# Patient Record
Sex: Female | Born: 1965 | Race: Black or African American | Hispanic: No | Marital: Single | Smoking: Never smoker
Health system: Southern US, Community
[De-identification: ages and names within clinical notes are randomized; demographics above are authoritative.]

## PROBLEM LIST (undated history)

## (undated) HISTORY — PX: GASTRIC BYPASS: SHX52

## (undated) HISTORY — PX: HERNIA REPAIR: SHX51

---

## 2003-10-22 ENCOUNTER — Other Ambulatory Visit: Admission: RE | Admit: 2003-10-22 | Discharge: 2003-10-22 | Payer: Self-pay | Admitting: Obstetrics and Gynecology

## 2011-04-19 ENCOUNTER — Emergency Department (HOSPITAL_COMMUNITY)
Admission: EM | Admit: 2011-04-19 | Discharge: 2011-04-19 | Disposition: A | Discharge status: Home / Self Care | Payer: Self-pay | Attending: Emergency Medicine | Admitting: Emergency Medicine

## 2011-04-19 DIAGNOSIS — B370 Candidal stomatitis: Secondary | ICD-10-CM | POA: Insufficient documentation

## 2011-05-26 ENCOUNTER — Other Ambulatory Visit: Payer: Self-pay | Admitting: *Deleted

## 2011-05-26 DIAGNOSIS — Z1231 Encounter for screening mammogram for malignant neoplasm of breast: Secondary | ICD-10-CM

## 2011-06-14 ENCOUNTER — Ambulatory Visit: Payer: Self-pay

## 2012-11-27 ENCOUNTER — Ambulatory Visit (HOSPITAL_COMMUNITY)
Admission: RE | Admit: 2012-11-27 | Discharge: 2012-11-27 | Disposition: A | Discharge status: Home / Self Care | Payer: Medicaid Other | Source: Ambulatory Visit | Attending: Obstetrics & Gynecology | Admitting: Obstetrics & Gynecology

## 2012-11-27 ENCOUNTER — Other Ambulatory Visit (HOSPITAL_COMMUNITY): Payer: Self-pay | Admitting: *Deleted

## 2012-11-27 ENCOUNTER — Other Ambulatory Visit: Payer: Self-pay

## 2012-11-27 DIAGNOSIS — Z1231 Encounter for screening mammogram for malignant neoplasm of breast: Secondary | ICD-10-CM

## 2012-11-28 ENCOUNTER — Ambulatory Visit (HOSPITAL_COMMUNITY): Payer: Self-pay

## 2012-12-14 ENCOUNTER — Ambulatory Visit: Payer: Self-pay

## 2015-09-22 ENCOUNTER — Other Ambulatory Visit: Payer: Self-pay | Admitting: Gastroenterology

## 2015-09-22 DIAGNOSIS — Z139 Encounter for screening, unspecified: Secondary | ICD-10-CM

## 2015-09-22 DIAGNOSIS — Z8371 Family history of colonic polyps: Secondary | ICD-10-CM

## 2015-10-09 ENCOUNTER — Inpatient Hospital Stay: Admission: RE | Admit: 2015-10-09 | Payer: Medicaid Other | Source: Ambulatory Visit

## 2016-06-08 ENCOUNTER — Other Ambulatory Visit: Payer: Self-pay | Admitting: Gastroenterology

## 2016-06-08 DIAGNOSIS — Z8371 Family history of colonic polyps: Secondary | ICD-10-CM

## 2016-06-16 ENCOUNTER — Ambulatory Visit
Admission: RE | Admit: 2016-06-16 | Discharge: 2016-06-16 | Disposition: A | Discharge status: Home / Self Care | Payer: 59 | Source: Ambulatory Visit | Attending: Gastroenterology | Admitting: Gastroenterology

## 2016-06-16 DIAGNOSIS — Z8371 Family history of colonic polyps: Secondary | ICD-10-CM

## 2018-02-09 DIAGNOSIS — Z0000 Encounter for general adult medical examination without abnormal findings: Secondary | ICD-10-CM | POA: Diagnosis not present

## 2018-02-09 DIAGNOSIS — Z13220 Encounter for screening for lipoid disorders: Secondary | ICD-10-CM | POA: Diagnosis not present

## 2018-02-09 DIAGNOSIS — Z9884 Bariatric surgery status: Secondary | ICD-10-CM | POA: Diagnosis not present

## 2018-02-20 DIAGNOSIS — L720 Epidermal cyst: Secondary | ICD-10-CM | POA: Diagnosis not present

## 2018-05-09 DIAGNOSIS — Z23 Encounter for immunization: Secondary | ICD-10-CM | POA: Diagnosis not present

## 2018-05-25 IMAGING — CT CT VIRTUAL COLONOSCOPY SCREENING
3 of 7 series · 13 of 36 positions shown, 19 images · non-contrast
Comparison: None.

CLINICAL DATA: Family history of colon polyps. Gastric bypass in
0111.

EXAM:
CT VIRTUAL COLONOSCOPY SCREENING
TECHNIQUE: The patient was given a standard bowel preparation with Gastrografin
and barium for fluid and stool tagging respectively. The quality of
the bowel preparation is good. Automated CO2 insufflation of the
colon was performed prior to image acquisition and colonic
distention is good. Image post processing was used to generate a 3D
endoluminal fly-through projection of the colon and to
electronically subtract stool/fluid as appropriate.

[Series 2: supine (id) · axial · 0.98mm/px · z∈[-311,+69]mm · 7 of 406 slices shown, 12 images]
[im 51/406  soft-tissue]
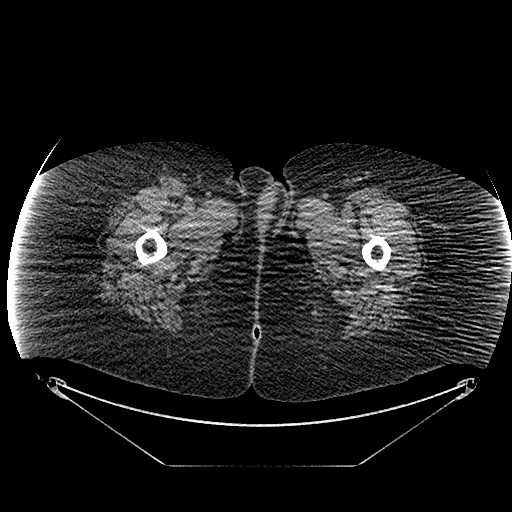
[im 51/406  bone]
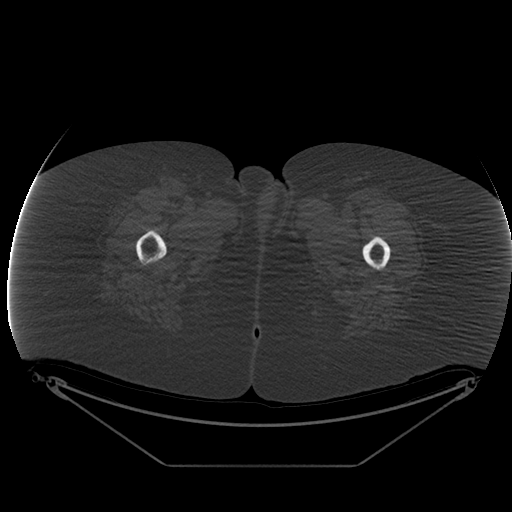
[im 102/406  soft-tissue]
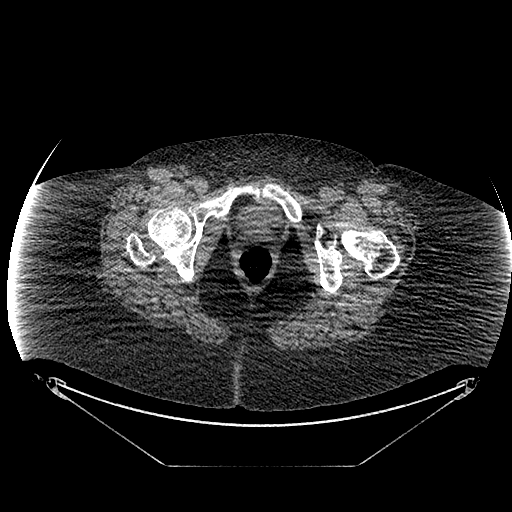
[im 152/406  soft-tissue]
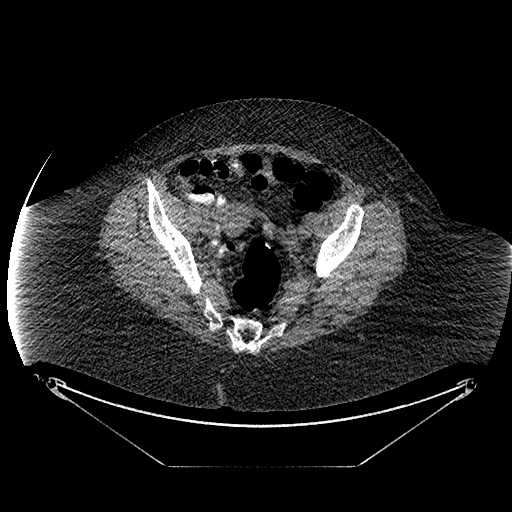
[im 203/406  soft-tissue]
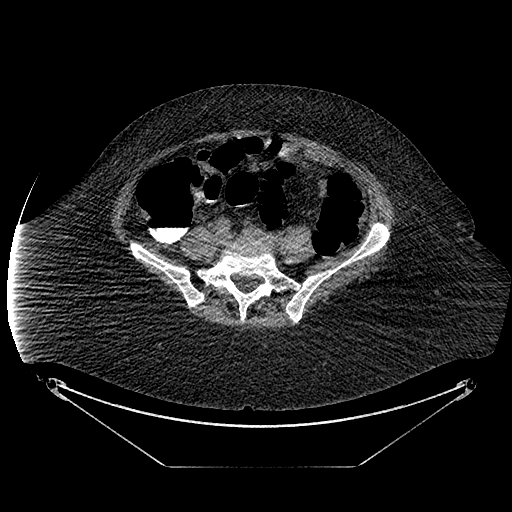
[im 203/406  lung]
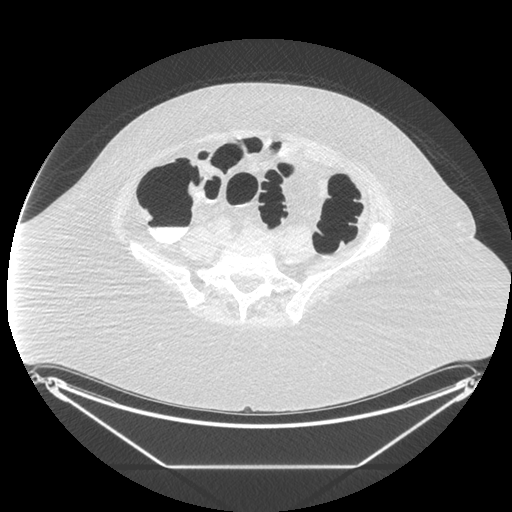
[im 254/406  soft-tissue]
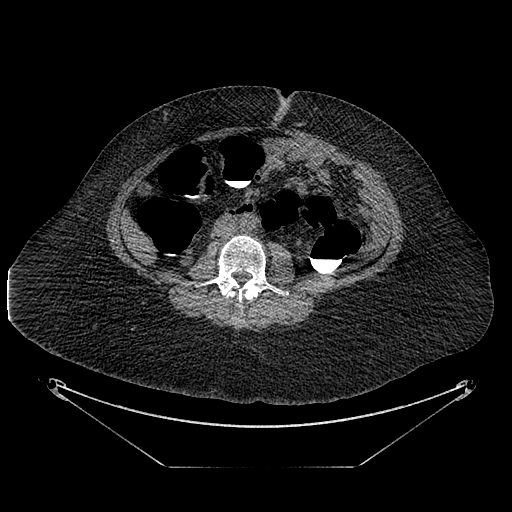
[im 254/406  lung]
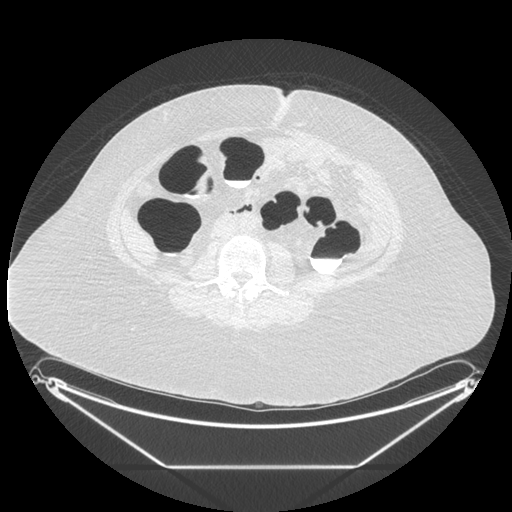
[im 304/406  soft-tissue]
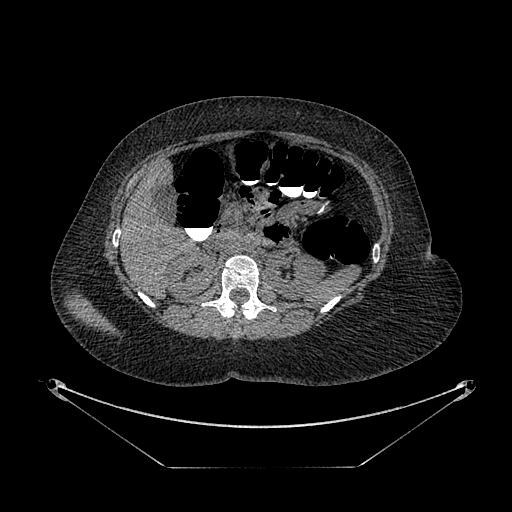
[im 304/406  lung]
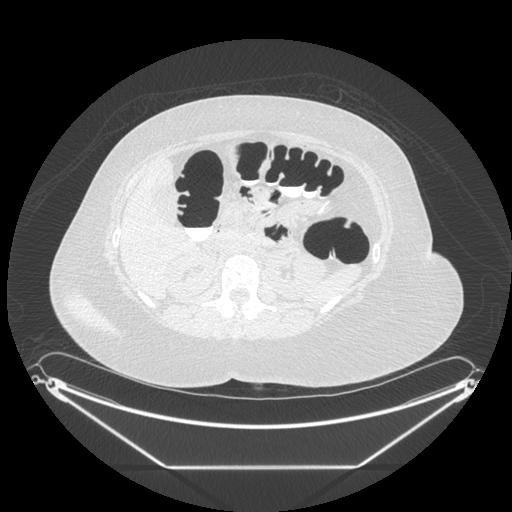
[im 355/406  soft-tissue]
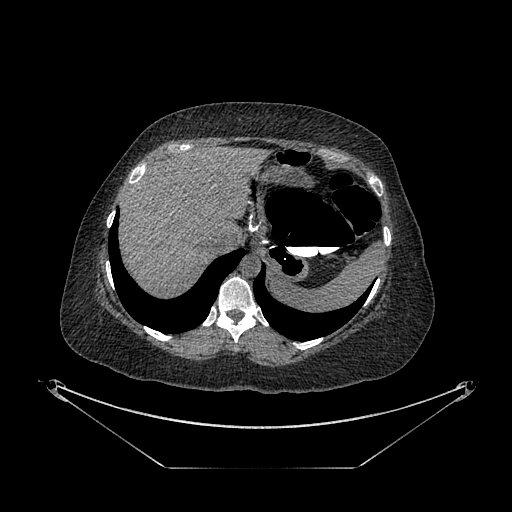
[im 355/406  lung]
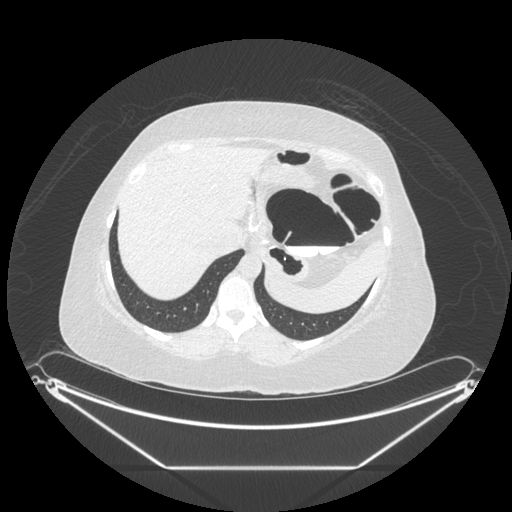

[Series 6: prone (id) · axial · 0.98mm/px · z∈[-476,-209]mm · 5 of 427 slices shown]
[im 54/427  soft-tissue]
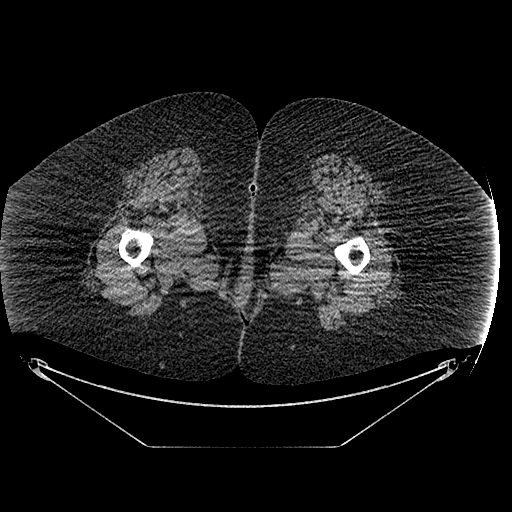
[im 107/427  soft-tissue]
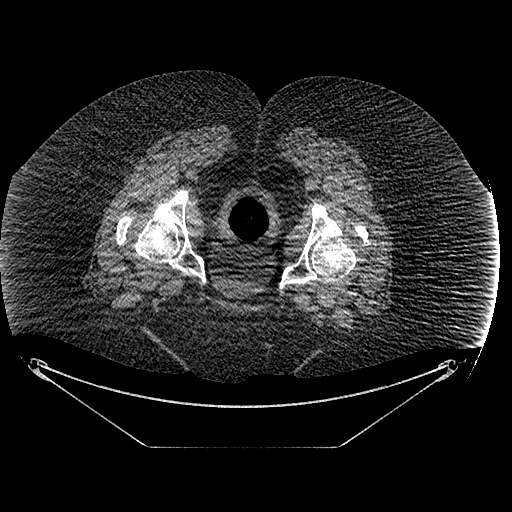
[im 160/427  soft-tissue]
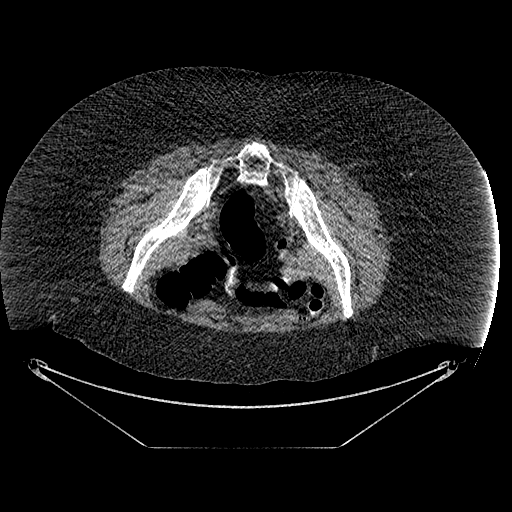
[im 214/427  soft-tissue]
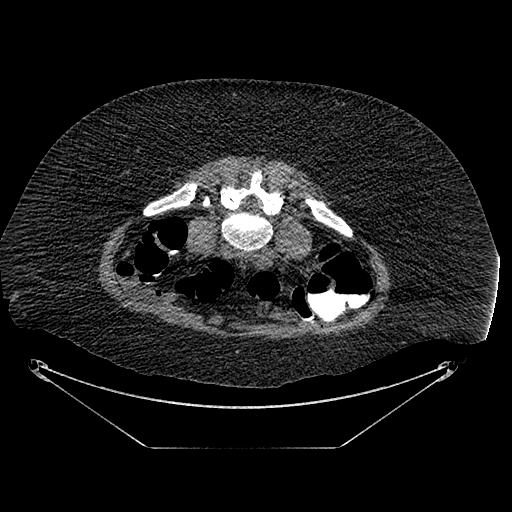
[im 267/427  soft-tissue]
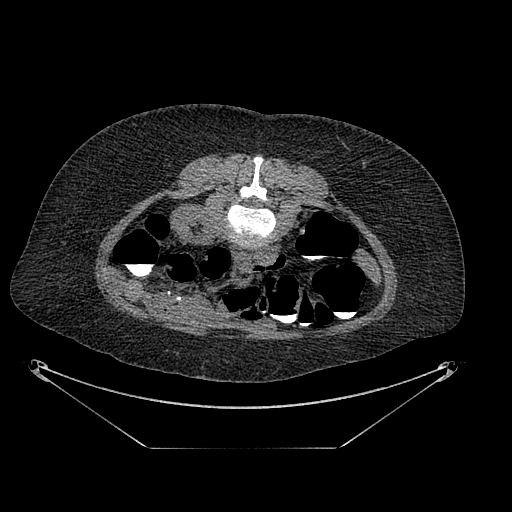

[Series 601: coronal body · coronal · 0.99mm/px · 1 of 153 slices shown, 2 images]
[im 51/153  soft-tissue]
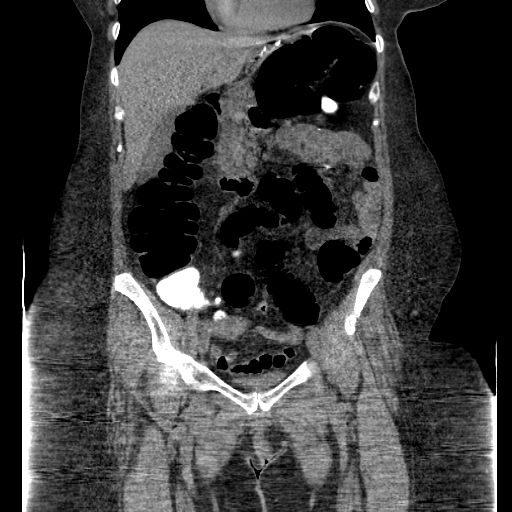
[im 51/153  bone]
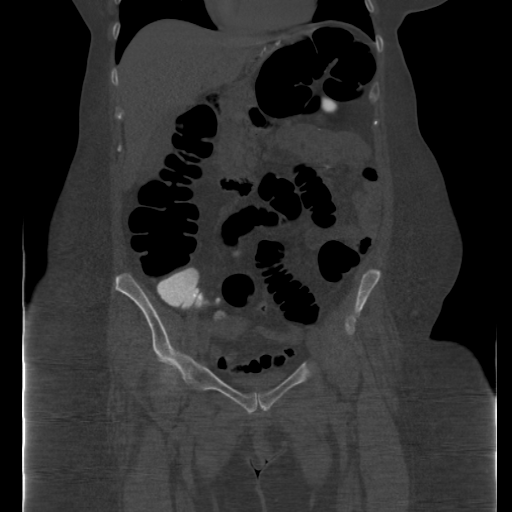

[13 of 36 positions shown; findings below may reference images not displayed]

FINDINGS: Small amount of retained contrast and tagged stool. No evidence of
clinically significant colonic polyp or mass.
IMPRESSION: No evidence of clinically significant colonic polyp or mass.

Virtual colonoscopy is not designed to detect diminutive polyps
(i.e., less than or equal to 5 mm), the presence or absence of which
may not affect clinical management.

CT ABDOMEN AND PELVIS WITHOUT CONTRAST
FINDINGS: Lower chest: Clear lung bases. Normal heart size without pericardial
or pleural effusion.

Hepatobiliary: Normal liver. Normal gallbladder, without biliary
ductal dilatation.

Pancreas: Normal, without mass or ductal dilatation.

Spleen: Normal in size, without focal abnormality.

Adrenals/Urinary Tract: Normal adrenal glands. No renal calculi or
hydronephrosis. Decompressed urinary bladder.

Stomach/Bowel: Status post gastric bypass. No contrast in the
bypassed stomach. The Roux loop is positioned anterior to the
transverse colon.

Vascular/Lymphatic: Normal caliber of the aorta and branch vessels.
No abdominopelvic adenopathy.

Reproductive: Normal uterus and adnexa.

Other: No significant free fluid.

Musculoskeletal: No acute osseous abnormality.

1. No acute extracolonic findings within the abdomen or pelvis.
2. Status post gastric bypass.

## 2018-06-29 DIAGNOSIS — Z1231 Encounter for screening mammogram for malignant neoplasm of breast: Secondary | ICD-10-CM | POA: Diagnosis not present

## 2018-06-29 DIAGNOSIS — Z803 Family history of malignant neoplasm of breast: Secondary | ICD-10-CM | POA: Diagnosis not present

## 2022-05-08 ENCOUNTER — Other Ambulatory Visit: Payer: Self-pay

## 2022-05-08 ENCOUNTER — Observation Stay (HOSPITAL_COMMUNITY)
Admission: EM | Admit: 2022-05-08 | Discharge: 2022-05-11 | Disposition: A | Discharge status: Home / Self Care | Payer: 59 | Attending: Family Medicine | Admitting: Family Medicine

## 2022-05-08 DIAGNOSIS — G459 Transient cerebral ischemic attack, unspecified: Secondary | ICD-10-CM | POA: Diagnosis present

## 2022-05-08 DIAGNOSIS — H532 Diplopia: Secondary | ICD-10-CM

## 2022-05-08 DIAGNOSIS — R002 Palpitations: Secondary | ICD-10-CM | POA: Insufficient documentation

## 2022-05-08 DIAGNOSIS — E785 Hyperlipidemia, unspecified: Secondary | ICD-10-CM | POA: Diagnosis not present

## 2022-05-08 DIAGNOSIS — R2681 Unsteadiness on feet: Secondary | ICD-10-CM | POA: Diagnosis not present

## 2022-05-08 NOTE — ED Triage Notes (Addendum)
Pt reports having an episode of double vision today and reports having an episode (x2) of irregular heart beat last week.

## 2022-05-09 ENCOUNTER — Encounter (HOSPITAL_COMMUNITY): Payer: Self-pay

## 2022-05-09 ENCOUNTER — Emergency Department (HOSPITAL_COMMUNITY): Payer: 59

## 2022-05-09 DIAGNOSIS — G459 Transient cerebral ischemic attack, unspecified: Secondary | ICD-10-CM | POA: Diagnosis present

## 2022-05-09 DIAGNOSIS — H532 Diplopia: Secondary | ICD-10-CM

## 2022-05-09 DIAGNOSIS — I651 Occlusion and stenosis of basilar artery: Secondary | ICD-10-CM | POA: Diagnosis not present

## 2022-05-09 DIAGNOSIS — E785 Hyperlipidemia, unspecified: Secondary | ICD-10-CM | POA: Diagnosis not present

## 2022-05-09 DIAGNOSIS — R2681 Unsteadiness on feet: Secondary | ICD-10-CM | POA: Diagnosis not present

## 2022-05-09 DIAGNOSIS — R002 Palpitations: Secondary | ICD-10-CM | POA: Diagnosis not present

## 2022-05-09 LAB — CBC WITH DIFFERENTIAL/PLATELET
Abs Immature Granulocytes: 0.06 10*3/uL (ref 0.00–0.07)
Basophils Absolute: 0.1 10*3/uL (ref 0.0–0.1)
Basophils Relative: 1 %
Eosinophils Absolute: 0 10*3/uL (ref 0.0–0.5)
Eosinophils Relative: 0 %
HCT: 43 % (ref 36.0–46.0)
Hemoglobin: 13.4 g/dL (ref 12.0–15.0)
Immature Granulocytes: 1 %
Lymphocytes Relative: 29 %
Lymphs Abs: 2.1 10*3/uL (ref 0.7–4.0)
MCH: 25.9 pg — ABNORMAL LOW (ref 26.0–34.0)
MCHC: 31.2 g/dL (ref 30.0–36.0)
MCV: 83.2 fL (ref 80.0–100.0)
Monocytes Absolute: 0.5 10*3/uL (ref 0.1–1.0)
Monocytes Relative: 7 %
Neutro Abs: 4.5 10*3/uL (ref 1.7–7.7)
Neutrophils Relative %: 62 %
Platelets: 251 10*3/uL (ref 150–400)
RBC: 5.17 MIL/uL — ABNORMAL HIGH (ref 3.87–5.11)
RDW: 14.4 % (ref 11.5–15.5)
WBC: 7.3 10*3/uL (ref 4.0–10.5)
nRBC: 0 % (ref 0.0–0.2)

## 2022-05-09 LAB — COMPREHENSIVE METABOLIC PANEL
ALT: 10 U/L (ref 0–44)
AST: 15 U/L (ref 15–41)
Albumin: 4 g/dL (ref 3.5–5.0)
Alkaline Phosphatase: 84 U/L (ref 38–126)
Anion gap: 5 (ref 5–15)
BUN: 15 mg/dL (ref 6–20)
CO2: 26 mmol/L (ref 22–32)
Calcium: 8.9 mg/dL (ref 8.9–10.3)
Chloride: 109 mmol/L (ref 98–111)
Creatinine, Ser: 0.6 mg/dL (ref 0.44–1.00)
GFR, Estimated: >60 mL/min (ref >60)
Glucose, Bld: 98 mg/dL (ref 70–99)
Potassium: 3.7 mmol/L (ref 3.5–5.1)
Sodium: 140 mmol/L (ref 135–145)
Total Bilirubin: 0.5 mg/dL (ref 0.3–1.2)
Total Protein: 7 g/dL (ref 6.5–8.1)

## 2022-05-09 LAB — HEMOGLOBIN A1C
Hgb A1c MFr Bld: 4.9 % (ref 4.8–5.6)
Mean Plasma Glucose: 93.93 mg/dL

## 2022-05-09 LAB — PROTIME-INR
INR: 1.1 (ref 0.8–1.2)
Prothrombin Time: 13.9 seconds (ref 11.4–15.2)

## 2022-05-09 LAB — HIV ANTIBODY (ROUTINE TESTING W REFLEX): HIV Screen 4th Generation wRfx: NONREACTIVE

## 2022-05-09 MED ORDER — STROKE: EARLY STAGES OF RECOVERY BOOK
Freq: Once | Status: AC
  Filled 2022-05-09: qty 1

## 2022-05-09 MED ORDER — ASPIRIN 81 MG PO TBEC: 81.0000 mg | DELAYED_RELEASE_TABLET | Freq: Every day | ORAL | Status: DC

## 2022-05-09 MED ORDER — SODIUM CHLORIDE (PF) 0.9 % IJ SOLN
INTRAMUSCULAR | Status: AC
  Filled 2022-05-09: qty 50

## 2022-05-09 MED ORDER — ACETAMINOPHEN 650 MG RE SUPP: 650.0000 mg | RECTAL | Status: DC | PRN

## 2022-05-09 MED ORDER — ASPIRIN 81 MG PO TBEC
81.0000 mg | DELAYED_RELEASE_TABLET | Freq: Once | ORAL | Status: AC
  Administered 2022-05-09: 81 mg via ORAL
  Filled 2022-05-09: qty 1

## 2022-05-09 MED ORDER — GADOBUTROL 1 MMOL/ML IV SOLN
9.0000 mL | Freq: Once | INTRAVENOUS | Status: AC | PRN
  Administered 2022-05-09: 9 mL via INTRAVENOUS

## 2022-05-09 MED ORDER — ASPIRIN 81 MG PO TBEC: 81.0000 mg | DELAYED_RELEASE_TABLET | Freq: Once | ORAL | Status: DC

## 2022-05-09 MED ORDER — ACETAMINOPHEN 160 MG/5ML PO SOLN: 650.0000 mg | ORAL | Status: DC | PRN

## 2022-05-09 MED ORDER — IOHEXOL 350 MG/ML SOLN
100.0000 mL | Freq: Once | INTRAVENOUS | Status: AC | PRN
  Administered 2022-05-09: 100 mL via INTRAVENOUS

## 2022-05-09 MED ORDER — ACETAMINOPHEN 325 MG PO TABS: 650.0000 mg | ORAL_TABLET | ORAL | Status: DC | PRN

## 2022-05-09 MED ORDER — ENOXAPARIN SODIUM 40 MG/0.4ML IJ SOSY
40.0000 mg | PREFILLED_SYRINGE | INTRAMUSCULAR | Status: DC
  Administered 2022-05-09 – 2022-05-10 (×2): 40 mg via SUBCUTANEOUS
  Filled 2022-05-09 (×2): qty 0.4

## 2022-05-09 NOTE — Consult Note (Signed)
NEUROLOGY CONSULTATION NOTE   Date of service: May 09, 2022 Patient Name: Natasha Conner MRN:  [id number] DOB:  28-Apr-1966 Reason for consult: "diplopia" Requesting Provider: Elgie Congo, MD _ _ _   _ __   _ __ _ _  __ __   _ __   __ _  History of Present Illness  Natasha Conner is a 56 y.o. female with no significant PMH who had diplopia that started yesterday around 2000. She was on her phone but also watching TV. She was having trouble reading text on her phone. When she would close either of her eye, she was able to read fine. She rested her eyes and noticed that the distorted vision persisted. She talked to her family and came to the ED at [facility] last night for further evaluation.   MRI brain was negative for an acute stroke. CT angio demonstrated tortuous and irregular appearance of the basilar artery of unclear significance. Case was discussed with neuro IR and she was transferred to [facility] for a cerebral angiogram.  No prior hx of stroke, no family hx of strokes. No smoking, no chewing tobacco. Rare etoh use.  Diplopia is worse when she looks to her right and resolves in primary gaze or when looking to her left or up.  LKW: 2000 on 05/08/22 mRS: 0 tNKASE:  not offered, mild deficit Thrombectomy: not offered, mild deficit and low suspicion for LVO NIHSS components Score: Comment  1a Level of Conscious 0[x]  1[]  2[]  3[]      1b LOC Questions 0[x]  1[]  2[]       1c LOC Commands 0[x]  1[]  2[]       2 Best Gaze 0[x]  1[]  2[]       3 Visual 0[x]  1[]  2[]  3[]      4 Facial Palsy 0[x]  1[]  2[]  3[]      5a Motor Arm - left 0[x]  1[]  2[]  3[]  4[]  UN[]    5b Motor Arm - Right 0[x]  1[]  2[]  3[]  4[]  UN[]    6a Motor Leg - Left 0[x]  1[]  2[]  3[]  4[]  UN[]    6b Motor Leg - Right 0[x]  1[]  2[]  3[]  4[]  UN[]    7 Limb Ataxia 0[x]  1[]  2[]  3[]  UN[]     8 Sensory 0[x]  1[]  2[]  UN[]      9 Best Language 0[x]  1[]  2[]  3[]      10 Dysarthria 0[x]  1[]  2[]  UN[]      11 Extinct. and Inattention 0[x]  1[]  2[]        TOTAL: 0      ROS   Constitutional Denies weight loss, fever and chills.   HEENT + changes in vision with intact hearing.   Respiratory Denies SOB and cough.   CV Denies palpitations and CP   GI Denies abdominal pain, nausea, vomiting and diarrhea.   GU Denies dysuria and urinary frequency.   MSK Denies myalgia and joint pain.   Skin Denies rash and pruritus.   Neurological Denies headache and syncope.   Psychiatric Denies recent changes in mood. Denies anxiety and depression.    Past History  History reviewed. No pertinent past medical history. Past Surgical History:  Procedure Laterality Date  . CESAREAN SECTION    . GASTRIC BYPASS    . HERNIA REPAIR     History reviewed. No pertinent family history. Social History   Socioeconomic History  . Marital status: Single    Spouse name: Not on file  . Number of children: Not on file  . Years of education: Not on file  .  Highest education level: Not on file  Occupational History  . Not on file  Tobacco Use  . Smoking status: Never  . Smokeless tobacco: Never  Substance and Sexual Activity  . Alcohol use: Never  . Drug use: Never  . Sexual activity: Not Currently  Other Topics Concern  . Not on file  Social History Narrative  . Not on file   Social Determinants of Health   Financial Resource Strain: Not on file  Food Insecurity: Not on file  Transportation Needs: Not on file  Physical Activity: Not on file  Stress: Not on file  Social Connections: Not on file   No Known Allergies  Medications  (Not in a hospital admission)    Vitals   Vitals:   05/09/22 0830 05/09/22 0940 05/09/22 1050 05/09/22 1214  BP: 111/65 133/66 (!) 130/113 135/72  Pulse: 63 61 63 70  Resp: 20 18 18 18   Temp:    98 F (36.7 C)  TempSrc:    Oral  SpO2: 100% 100% 100% 100%  Weight:      Height:         Body mass index is 32.45 kg/m.  Physical Exam   General: Laying comfortably in bed; in no acute distress.  HENT:  Normal oropharynx and mucosa. Normal external appearance of ears and nose.  Neck: Supple, no pain or tenderness  CV: No JVD. No peripheral edema. Pulmonary: Symmetric Chest rise. Normal respiratory effort.  Abdomen: Soft to touch, non-tender.  Ext: No cyanosis, edema, or deformity  Skin: No rash. Normal palpation of skin.   Musculoskeletal: Normal digits and nails by inspection. No clubbing.   Neurologic Examination  Mental status/Cognition: Alert, oriented to self, place, month and year, good attention.  Speech/language: Fluent, comprehension intact, object naming intact, repetition intact.  Cranial nerves:   CN II Pupils equal and reactive to light, no VF deficits    CN III,IV,VI EOM intact, no gaze preference or deviation, no nystagmus    CN V normal sensation in V1, V2, and V3 segments bilaterally    CN VII no asymmetry, no nasolabial fold flattening    CN VIII normal hearing to speech    CN IX & X normal palatal elevation, no uvular deviation    CN XI 5/5 head turn and 5/5 shoulder shrug bilaterally    CN XII midline tongue protrusion    Motor:  Muscle bulk: normal, tone normal, pronator drift none tremor none Mvmt Root Nerve  Muscle Right Left Comments  SA C5/6 Ax Deltoid 5 5   EF C5/6 Mc Biceps 5 5   EE C6/7/8 Rad Triceps 5 5   WF C6/7 Med FCR     WE C7/8 PIN ECU     F Ab C8/T1 U ADM/FDI 5 5   HF L1/2/3 Fem Illopsoas 5 5   KE L2/3/4 Fem Quad 5 5   DF L4/5 D Peron Tib Ant 5 5   PF S1/2 Tibial Grc/Sol 5 5    Reflexes:  Right Left Comments  Pectoralis      Biceps (C5/6) 2 2   Brachioradialis (C5/6) 2 2    Triceps (C6/7) 2 2    Patellar (L3/4) 2 2    Achilles (S1)      Hoffman      Plantar     Jaw jerk    Sensation:  Light touch Intact throughout   Pin prick    Temperature    Vibration   Proprioception  Coordination/Complex Motor:  - Finger to Nose intact BL - Heel to shin intact BL - Rapid alternating movement are normal - Gait: Deferred.  Labs    CBC:  Recent Labs  Lab 05/09/22 0002  WBC 7.3  NEUTROABS 4.5  HGB 13.4  HCT 43.0  MCV 83.2  PLT 251    Basic Metabolic Panel:  Lab Results  Component Value Date   NA 140 05/09/2022   K 3.7 05/09/2022   CO2 26 05/09/2022   GLUCOSE 98 05/09/2022   BUN 15 05/09/2022   CREATININE 0.60 05/09/2022   CALCIUM 8.9 05/09/2022   GFRNONAA >60 05/09/2022   Lipid Panel: No results found for: "LDLCALC" HgbA1c: No results found for: "HGBA1C" Urine Drug Screen: No results found for: "LABOPIA", "COCAINSCRNUR", "LABBENZ", "AMPHETMU", "THCU", "LABBARB"  Alcohol Level No results found for: "ETH"  CT Head without contrast(Personally reviewed): CTH was negative for a large hypodensity concerning for a large territory infarct or hyperdensity concerning for an ICH  CT angio Head and Neck with contrast(Personally reviewed): 1. 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 Catheter-based angiography is recommended for further characterization. 2. No intracranial large vessel occlusion or proximal high-grade arterial stenosis.  MRI Brain(Personally reviewed): No acute stroke  Impression   Natasha Conner is a 56 y.o. female with no significant hx who presents with diplopia on looking to her right. Exam with no deficit, no gaze paresis or palsy. Visual acuity is intact. Suspect that this could represent a small MRI negative stroke in the brainstem vs TIA.  Given the tortuous and irregular appearing basilar artery, I do think that this needs to be worked up with cerebral angiogram.  Recommendations   - Frequent Neuro checks per stroke unit protocol - Recommend Cerebral angiogram. - Recommend obtaining TTE - Recommend obtaining Lipid panel with LDL - Please start statin if LDL > 70 - Recommend HbA1c - Antithrombotic - will do aspirin 81mg  one time today. Antiplatelet per Neuro  IR tomorrow depending on angiogram findings. - Recommend DVT ppx - SBP goal - permissive hypertension first 24 h < 220/110. Hold home meds today. Goal SBP per Neuro IR after angiogram tomorrow. - Recommend Telemetry monitoring for arrythmia - Recommend bedside swallow screen prior to PO intake. - Stroke education booklet - Recommend PT/OT/SLP consult ______________________________________________________________________   Thank you for the opportunity to take part in the care of this patient. If you have any further questions, please contact the neurology consultation attending.  Signed,  [facility] Pager Number Erick [phone] _ _ _   _ __   _ __ _ _  __ __   _ __   __ _

## 2022-05-09 NOTE — Progress Notes (Signed)
Came from ER a female alert oriented pt not in distress on room air, ambulatory to room 4 bed, connected pt to tele box 40, tele department informed and verified by RN Elmyra Ricks, 1 peripheral IV at right forearm, oriented pt to room and call bell, educated to call first before going out of bed, pt agreed, pt did personal hygiene without assist, activities well tolerated, informed that she can eat with heart healthy diet now and the NPO starting 12 midnight, pt agreed

## 2022-05-09 NOTE — Plan of Care (Signed)

## 2022-05-09 NOTE — H&P (Signed)
History and Physical    Natasha Conner IRS:[id number] DOB: 23-Sep-1965 DOA: 05/08/2022  PCP: Sigmund Hazel, MD (Confirm with patient/family/NH records and if not entered, this has to be entered at [facility] point of entry) Patient coming from: Home  I have personally briefly reviewed patient's old medical records in [facility]  Chief Complaint: Double vision  HPI: Natasha Conner is a 56 y.o. female with no significant medical history presented with double vision.  Patient started to have blurry vision and double vision yesterday evening after dinner, denies any numbness weakness of any of the limbs, no hearing changes no headache.  First episode.  By itself, then patient experienced another episode and came to the ED.  CT head negative, MRI with and without contrast showed abnormal mild basilar artery appears to be torturous irregular in the prepontine cistern.  Overnight, patient had several other episodes of similar blurry vision and double vision all resolved by themselves.  This morning, patient started to feel episode of tingling of left 5 fingers.  Otherwise patient has been healthy, not taking any medications.    Review of Systems: As per HPI otherwise 14 point review of systems negative.    History reviewed. No pertinent past medical history.  Past Surgical History:  Procedure Laterality Date   CESAREAN SECTION     GASTRIC BYPASS     HERNIA REPAIR       reports that she has never smoked. She has never used smokeless tobacco. She reports that she does not drink alcohol and does not use drugs.  No Known Allergies  History reviewed. No pertinent family history.   Prior to Admission medications   Not on File    Physical Exam: Vitals:   05/09/22 0830 05/09/22 0940 05/09/22 1050 05/09/22 1214  BP: 111/65 133/66 (!) 130/113 135/72  Pulse: 63 61 63 70  Resp: 20 18 18 18   Temp:    98 F (36.7 C)  TempSrc:    Oral  SpO2: 100% 100% 100% 100%  Weight:      Height:         Constitutional: NAD, calm, comfortable Vitals:   05/09/22 0830 05/09/22 0940 05/09/22 1050 05/09/22 1214  BP: 111/65 133/66 (!) 130/113 135/72  Pulse: 63 61 63 70  Resp: 20 18 18 18   Temp:    98 F (36.7 C)  TempSrc:    Oral  SpO2: 100% 100% 100% 100%  Weight:      Height:       Eyes: PERRL, lids and conjunctivae normal ENMT: Mucous membranes are moist. Posterior pharynx clear of any exudate or lesions.Normal dentition.  Neck: normal, supple, no masses, no thyromegaly Respiratory: clear to auscultation bilaterally, no wheezing, no crackles. Normal respiratory effort. No accessory muscle use.  Cardiovascular: Regular rate and rhythm, no murmurs / rubs / gallops. No extremity edema. 2+ pedal pulses. No carotid bruits.  Abdomen: no tenderness, no masses palpated. No hepatosplenomegaly. Bowel sounds positive.  Musculoskeletal: no clubbing / cyanosis. No joint deformity upper and lower extremities. Good ROM, no contractures. Normal muscle tone.  Skin: no rashes, lesions, ulcers. No induration Neurologic: CN 2-12 grossly intact. Sensation intact, DTR normal. Strength 5/5 in all 4.  Psychiatric: Normal judgment and insight. Alert and oriented x 3. Normal mood.     Labs on Admission: I have personally reviewed following labs and imaging studies  CBC: Recent Labs  Lab 05/09/22 0002  WBC 7.3  NEUTROABS 4.5  HGB 13.4  HCT 43.0  MCV  83.2  PLT 251   Basic Metabolic Panel: Recent Labs  Lab 05/09/22 0002  NA 140  K 3.7  CL 109  CO2 26  GLUCOSE 98  BUN 15  CREATININE 0.60  CALCIUM 8.9   GFR: Estimated Creatinine Clearance: 86.7 mL/min (by C-G formula based on SCr of 0.6 mg/dL). Liver Function Tests: Recent Labs  Lab 05/09/22 0002  AST 15  ALT 10  ALKPHOS 84  BILITOT 0.5  PROT 7.0  ALBUMIN 4.0   No results for input(s): "LIPASE", "AMYLASE" in the last 168 hours. No results for input(s): "AMMONIA" in the last 168 hours. Coagulation Profile: No results for  input(s): "INR", "PROTIME" in the last 168 hours. Cardiac Enzymes: No results for input(s): "CKTOTAL", "CKMB", "CKMBINDEX", "TROPONINI" in the last 168 hours. BNP (last 3 results) No results for input(s): "PROBNP" in the last 8760 hours. HbA1C: No results for input(s): "HGBA1C" in the last 72 hours. CBG: No results for input(s): "GLUCAP" in the last 168 hours. Lipid Profile: No results for input(s): "CHOL", "HDL", "LDLCALC", "TRIG", "CHOLHDL", "LDLDIRECT" in the last 72 hours. Thyroid Function Tests: No results for input(s): "TSH", "T4TOTAL", "FREET4", "T3FREE", "THYROIDAB" in the last 72 hours. Anemia Panel: No results for input(s): "VITAMINB12", "FOLATE", "FERRITIN", "TIBC", "IRON", "RETICCTPCT" in the last 72 hours. Urine analysis: No results found for: "COLORURINE", "APPEARANCEUR", "LABSPEC", "PHURINE", "GLUCOSEU", "HGBUR", "BILIRUBINUR", "KETONESUR", "PROTEINUR", "UROBILINOGEN", "NITRITE", "LEUKOCYTESUR"  Radiological Exams on Admission: CT ANGIO HEAD NECK W WO CM  Result Date: 05/09/2022 CLINICAL DATA:  Diplopia. EXAM: CT ANGIOGRAPHY HEAD AND NECK TECHNIQUE: Multidetector CT imaging of the head and neck was performed using the standard protocol during bolus administration of intravenous contrast. Multiplanar CT image reconstructions and MIPs were obtained to evaluate the vascular anatomy. Carotid stenosis measurements (when applicable) are obtained utilizing NASCET criteria, using the distal internal carotid diameter as the denominator. RADIATION DOSE REDUCTION: This exam was performed according to the departmental dose-optimization program which includes automated exposure control, adjustment of the mA and/or kV according to patient size and/or use of iterative reconstruction technique. CONTRAST:  OMNIPAQUE IOHEXOL 350 MG/ML SOLN COMPARISON:  Same-day brain MRI 05/09/2022. FINDINGS: CT HEAD FINDINGS Brain: Cerebral volume is normal. There is no acute intracranial hemorrhage. No  demarcated cortical infarct. No extra-axial fluid collection. No evidence of an intracranial mass. No midline shift. Vascular: Hyperdense vessel.  Atherosclerotic calcifications. Skull: No fracture or aggressive osseous lesion. Sinuses/Orbits: No mass or acute finding within the imaged orbits. No significant paranasal sinus disease. Review of the MIP images confirms the above findings CTA NECK FINDINGS Aortic arch: Standard aortic branching. Mild atherosclerotic plaque within the visualized aortic arch. Streak and beam hardening artifact arising from a dense right-sided contrast bolus partially obscures the right subclavian artery. Within this limitation, there is no appreciable hemodynamically significant innominate or proximal subclavian artery stenosis. Right carotid system: CCA and ICA patent within the neck without stenosis or significant atherosclerotic disease. Left carotid system: CCA and ICA patent within the neck without stenosis or significant atherosclerotic disease. Vertebral arteries: Vertebral arteries codominant and patent within the neck without stenosis or significant atherosclerotic disease. Skeleton: Reversal of the expected cervical lordosis. Cervical spondylosis. Most notably at C4-C5, there is advanced disc space narrowing with a central disc protrusion and bilateral uncovertebral hypertrophy. Apparent mild spinal canal stenosis at this level. Other neck: No neck mass or cervical lymphadenopathy. Upper chest: No consolidation within the imaged lung apices. Review of the MIP images confirms the above findings CTA HEAD FINDINGS Anterior  circulation: The intracranial internal carotid arteries are patent. Non-stenotic calcified plaque within both vessels. The M1 middle cerebral arteries are patent. No M2 proximal branch occlusion or high-grade proximal stenosis. The anterior cerebral arteries are patent. No intracranial aneurysm is identified. Posterior circulation: The intracranial vertebral  arteries are patent. Tortuous and irregular appearance of the basilar artery. Additionally, a fenestration is questioned within the proximal basilar artery (for instance as seen on series 11, image 110). No high-grade basilar artery stenosis. The posterior cerebral arteries are patent. Mildly hypoplastic right P1 segment. Sizable posterior communicating arteries are present bilaterally. Venous sinuses: Within the limitations of contrast timing, no convincing thrombus. Anatomic variants: As described. Review of the MIP images confirms the above findings IMPRESSION: CT head: No evidence of acute intracranial abnormality. CTA neck: 1. The common carotid, internal carotid and vertebral arteries are patent within the neck without stenosis or significant atherosclerotic disease. 2.  Aortic Atherosclerosis (ICD10-I70.0). CTA head 1. 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 Catheter-based angiography is recommended for further characterization. 2. No intracranial large vessel occlusion or proximal high-grade arterial stenosis. Electronically Signed   By: Jackey Loge D.O.   On: 05/09/2022 10:39   MR BRAIN W WO CONTRAST  Result Date: 05/09/2022 CLINICAL DATA:  56 year old female with transient double vision. TIA. Palpitations. EXAM: MRI HEAD WITHOUT AND WITH CONTRAST TECHNIQUE: Multiplanar, multiecho pulse sequences of the brain and surrounding structures were obtained without and with intravenous contrast. CONTRAST:  11mL GADAVIST GADOBUTROL 1 MMOL/ML IV SOLN COMPARISON:  None Available. FINDINGS: Technologist notes some hair related susceptibility artifact on this exam. Left side dental hardware susceptibility artifact also. Brain: Normal cerebral volume. No restricted diffusion to suggest acute infarction. No midline shift, mass effect, evidence of mass lesion, ventriculomegaly, extra-axial collection  or acute intracranial hemorrhage. Cervicomedullary junction and pituitary are within normal limits. Scattered small mostly subcortical white matter T2 and FLAIR hyperintense foci in both hemispheres are mildly to moderately advanced for age. No associated cortical encephalomalacia or chronic cerebral blood products identified. Deep gray matter nuclei, brainstem and cerebellum appear normal. No abnormal enhancement identified.  No dural thickening. Vascular: Major intracranial vascular flow voids are preserved, however, the basilar artery appears abnormal on series 19, image 17, focally irregular and tortuous. This is also visible on postcontrast images (series 20, image 36) and does not appear directly related to a normal anatomic variation (such as persistent trigeminal artery). Although there do appear to be fetal type bilateral PCA origins. Other major intracranial vascular flow voids appear more normal. Major dural venous sinuses are enhancing and appear to be patent. Skull and upper cervical spine: Negative visible cervical spine. Visualized bone marrow signal is within normal limits. Sinuses/Orbits: Normal suprasellar cistern and optic chiasm. Orbits soft tissues appears symmetric and normal. Paranasal Visualized paranasal sinuses and mastoids are stable and well aerated. Other: Visible internal auditory structures appear normal. Grossly negative visible scalp and face. IMPRESSION: 1. 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 2. But no associated acute intracranial abnormality. Mild to moderate for age nonspecific cerebral white matter signal changes. 3. Recommend further evaluation of the Basilar Artery, and CTA is preferred since gadolinium contrast on this exam will contaminate any intracranial MRA until excreted by the kidneys. Electronically Signed   By: Odessa Fleming M.D.   On: 05/09/2022 07:08    EKG: Independently reviewed.  Sinus, first-degree AV  block  Assessment/Plan Principal Problem:   TIA (transient ischemic attack) Active Problems:   Diplopia  (please populate well all problems here in Problem List. (For example, if patient is on BP meds at home and you resume or decide to hold them, it is a problem that needs to be her. Same for CAD, COPD, HLD and so on)  Diplopia -TIA versus demyelination etiology -Neurology discussed with interventional radiology, plans for catheter placed angiogram to further investigate the abnormal MRI finding of tortuous basilar arteries. -Telemetry monitoring x24 hours, check A1c and lipid panel, echocardiogram -Further demyelination study as per neurology -Start ASA 81 mg daily  DVT prophylaxis: Lovenox Code Status: Full code Family Communication: Son at bedside Disposition Plan: Expect less than 2 midnight hospital stay Consults called: Neurology, interventional radiology Admission status: Tele observation   Lequita Halt MD [facility] Pager [phone] 05/09/2022, 3:19 PM

## 2022-05-09 NOTE — ED Notes (Signed)
Pt left bracelet in room when being transferred to [facility]. Attempted to call pt and pt's son to let them know but no answer.

## 2022-05-09 NOTE — ED Provider Notes (Signed)
Physical Exam  BP (!) 130/113 (BP Location: Right Arm)   Pulse 63   Temp 97.7 F (36.5 C) (Oral)   Resp 18   Ht 5' 5.5" (1.664 m)   Wt 89.8 kg   SpO2 100%   BMI 32.45 kg/m   Physical Exam Vitals and nursing note reviewed.  Constitutional:      Appearance: Normal appearance.  Eyes:     General: No scleral icterus. Pulmonary:     Effort: Pulmonary effort is normal. No respiratory distress.  Skin:    General: Skin is dry.     Findings: No rash.  Neurological:     General: No focal deficit present.     Mental Status: She is alert. Mental status is at baseline.     GCS: GCS eye subscore is 4. GCS verbal subscore is 5. GCS motor subscore is 6.     Cranial Nerves: No cranial nerve deficit, dysarthria or facial asymmetry.     Sensory: No sensory deficit.     Motor: No weakness.     Comments: No sensation deficit.  No pronator drift.  Cranial nerves II through XII grossly intact.  No facial asymmetry.  Patient answering questions appropriate with appropriate speech.  Strength symmetric and intact in bilateral upper and lower extremities.  Psychiatric:        Mood and Affect: Mood normal.     Procedures  Procedures  ED Course / MDM    Medical Decision Making Amount and/or Complexity of Data Reviewed Labs: ordered. Radiology: ordered.  Risk Prescription drug management.   Accepted handoff at shift change from [employer], [place]. Please see prior provider note for more detail.   Briefly: Patient is 56 y.o. F with diplopia last night that has since resolved. No focal deficit.   DDX: concern for TIA vs. Stroke vs. Demyelinating disease  Plan: Follow up on MRI  MRI showed  1. 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 2. But no associated acute intracranial abnormality. Mild to moderate for age nonspecific cerebral white matter signal changes. 3. Recommend further evaluation of the Basilar Artery, and CTA is preferred  since gadolinium contrast on this exam will contaminate any intracranial MRA until excreted by the kidneys.  On my evaluation, the patient reports that she has not had any recurrence of symptoms. Discussed that a CTA of her head and neck needs to be completed. Patient agrees with plan.  CTA of head and neck shows 1. 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 Catheter-based angiography is recommended for further characterization. 2. No intracranial large vessel occlusion or proximal high-grade arterial stenosis.  On re-evaluation, the patient reports that she was having visual changes while she was in CT and straight lines appeared curved. Denies any weakness or headaches.  Spoke with Dr. Selina Cooley with neurology. She consulted with IR neuro. Recommended transfer to [facility] for Catheter based angiography. Dr. Selina Cooley discussed with patient over the phone. Patient amenable.   Spoke with Dr. Rubin Payor at [facility] who accepts transfer.   [facility] to [facility] now.    CT ANGIO HEAD NECK W WO CM  Result Date: 05/09/2022 CLINICAL DATA:  Diplopia. EXAM: CT ANGIOGRAPHY HEAD AND NECK TECHNIQUE: Multidetector CT imaging of the head and neck was performed using the standard protocol during bolus administration of intravenous contrast. Multiplanar CT image reconstructions and  MIPs were obtained to evaluate the vascular anatomy. Carotid stenosis measurements (when applicable) are obtained utilizing NASCET criteria, using the distal internal carotid diameter as the denominator. RADIATION DOSE REDUCTION: This exam was performed according to the departmental dose-optimization program which includes automated exposure control, adjustment of the mA and/or kV according to patient size and/or use of iterative reconstruction technique. CONTRAST:  OMNIPAQUE IOHEXOL 350 MG/ML SOLN  COMPARISON:  Same-day brain MRI 05/09/2022. FINDINGS: CT HEAD FINDINGS Brain: Cerebral volume is normal. There is no acute intracranial hemorrhage. No demarcated cortical infarct. No extra-axial fluid collection. No evidence of an intracranial mass. No midline shift. Vascular: Hyperdense vessel.  Atherosclerotic calcifications. Skull: No fracture or aggressive osseous lesion. Sinuses/Orbits: No mass or acute finding within the imaged orbits. No significant paranasal sinus disease. Review of the MIP images confirms the above findings CTA NECK FINDINGS Aortic arch: Standard aortic branching. Mild atherosclerotic plaque within the visualized aortic arch. Streak and beam hardening artifact arising from a dense right-sided contrast bolus partially obscures the right subclavian artery. Within this limitation, there is no appreciable hemodynamically significant innominate or proximal subclavian artery stenosis. Right carotid system: CCA and ICA patent within the neck without stenosis or significant atherosclerotic disease. Left carotid system: CCA and ICA patent within the neck without stenosis or significant atherosclerotic disease. Vertebral arteries: Vertebral arteries codominant and patent within the neck without stenosis or significant atherosclerotic disease. Skeleton: Reversal of the expected cervical lordosis. Cervical spondylosis. Most notably at C4-C5, there is advanced disc space narrowing with a central disc protrusion and bilateral uncovertebral hypertrophy. Apparent mild spinal canal stenosis at this level. Other neck: No neck mass or cervical lymphadenopathy. Upper chest: No consolidation within the imaged lung apices. Review of the MIP images confirms the above findings CTA HEAD FINDINGS Anterior circulation: The intracranial internal carotid arteries are patent. Non-stenotic calcified plaque within both vessels. The M1 middle cerebral arteries are patent. No M2 proximal branch occlusion or high-grade  proximal stenosis. The anterior cerebral arteries are patent. No intracranial aneurysm is identified. Posterior circulation: The intracranial vertebral arteries are patent. Tortuous and irregular appearance of the basilar artery. Additionally, a fenestration is questioned within the proximal basilar artery (for instance as seen on series 11, image 110). No high-grade basilar artery stenosis. The posterior cerebral arteries are patent. Mildly hypoplastic right P1 segment. Sizable posterior communicating arteries are present bilaterally. Venous sinuses: Within the limitations of contrast timing, no convincing thrombus. Anatomic variants: As described. Review of the MIP images confirms the above findings IMPRESSION: CT head: No evidence of acute intracranial abnormality. CTA neck: 1. The common carotid, internal carotid and vertebral arteries are patent within the neck without stenosis or significant atherosclerotic disease. 2.  Aortic Atherosclerosis (ICD10-I70.0). CTA head 1. 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 Catheter-based angiography is recommended for further characterization. 2. No intracranial large vessel occlusion or proximal high-grade arterial stenosis. Electronically Signed   By: Jackey Loge D.O.   On: 05/09/2022 10:39   MR BRAIN W WO CONTRAST  Result Date: 05/09/2022 CLINICAL DATA:  56 year old female with transient double vision. TIA. Palpitations. EXAM: MRI HEAD WITHOUT AND WITH CONTRAST TECHNIQUE: Multiplanar, multiecho pulse sequences of the brain and surrounding structures were obtained without and with intravenous contrast. CONTRAST:  62mL GADAVIST GADOBUTROL 1 MMOL/ML IV SOLN COMPARISON:  None Available. FINDINGS: Technologist notes some hair related susceptibility artifact on this exam. Left side dental hardware susceptibility  artifact also. Brain: Normal cerebral  volume. No restricted diffusion to suggest acute infarction. No midline shift, mass effect, evidence of mass lesion, ventriculomegaly, extra-axial collection or acute intracranial hemorrhage. Cervicomedullary junction and pituitary are within normal limits. Scattered small mostly subcortical white matter T2 and FLAIR hyperintense foci in both hemispheres are mildly to moderately advanced for age. No associated cortical encephalomalacia or chronic cerebral blood products identified. Deep gray matter nuclei, brainstem and cerebellum appear normal. No abnormal enhancement identified.  No dural thickening. Vascular: Major intracranial vascular flow voids are preserved, however, the basilar artery appears abnormal on series 19, image 17, focally irregular and tortuous. This is also visible on postcontrast images (series 20, image 36) and does not appear directly related to a normal anatomic variation (such as persistent trigeminal artery). Although there do appear to be fetal type bilateral PCA origins. Other major intracranial vascular flow voids appear more normal. Major dural venous sinuses are enhancing and appear to be patent. Skull and upper cervical spine: Negative visible cervical spine. Visualized bone marrow signal is within normal limits. Sinuses/Orbits: Normal suprasellar cistern and optic chiasm. Orbits soft tissues appears symmetric and normal. Paranasal Visualized paranasal sinuses and mastoids are stable and well aerated. Other: Visible internal auditory structures appear normal. Grossly negative visible scalp and face. IMPRESSION: 1. 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 2. But no associated acute intracranial abnormality. Mild to moderate for age nonspecific cerebral white matter signal changes. 3. Recommend further evaluation of the Basilar Artery, and CTA is preferred since gadolinium contrast on this exam will contaminate any intracranial MRA  until excreted by the kidneys. Electronically Signed   By: Genevie Ann M.D.   On: 05/09/2022 07:08        Sherrell Puller, PA-C 05/09/22 1402    Godfrey Pick, MD 05/09/22 [phone]

## 2022-05-09 NOTE — ED Notes (Signed)
Patient transported to CT 

## 2022-05-09 NOTE — Progress Notes (Signed)
Neurology Interprofessional EMR/Internet Consultation  I received a call from PA Ms. Ovid Curd regarding this 56 yo patient who presented to ED yesterday c/o binocular diplopia. In resolved over the course of 4 hrs but it started again this AM and has persisted since that time. There is no headache or clinical concern for intracerebral hemorrhage. She underwent CTA H&N in [facility] ED which showed:  CTA neck:   1. The common carotid, internal carotid and vertebral arteries are patent within the neck without stenosis or significant atherosclerotic disease. 2.  Aortic Atherosclerosis (ICD10-I70.0).   CTA head   1. 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 Catheter-based angiography is recommended for further characterization. 2. No intracranial large vessel occlusion or proximal high-grade arterial stenosis.  I personally reviewed the images and agree with the above interpretations.  I discussed these findings with Dr. Jeanell Sparrow with neuro-IR at [facility]. 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 Since she currently has sx (diplopia) this should be done while inpatient. Ovid Curd will arrange ED-to-ED transfer for this patient after which she will undergo diagnostic arteriogram. I will update Dr. Lavell Luster neurohospitalist as well.  Su Monks, MD [facility] [phone]  If 7pm- 7am, please page neurology on call as listed in [facility].  Time spent on Interprofessional EMR/Internet Consultation 26 min

## 2022-05-09 NOTE — ED Provider Notes (Signed)
Patient transferred from outside facility due to abnormal MRI and CT angiogram.  Plan was to have catheter-based angiography.  Dr. Quinn Conner with the neurologist involved.  Dr. Karenann Conner was the accepting neurointerventual physician. I paged them to notify that the patient is in our department at this time.  It appears that the patient has already been consented for this procedure by Natasha Dryer, NP.  Physical Exam  BP 135/72 (BP Location: Right Arm)   Pulse 70   Temp 98 F (36.7 C) (Oral)   Resp 18   Ht 5' 5.5" (1.664 m)   Wt 89.8 kg   SpO2 100%   BMI 32.45 kg/m   Physical Exam Vitals and nursing note reviewed.  Constitutional:      Appearance: Normal appearance.  HENT:     Head: Normocephalic and atraumatic.  Eyes:     General: No scleral icterus.    Conjunctiva/sclera: Conjunctivae normal.  Pulmonary:     Effort: Pulmonary effort is normal. No respiratory distress.  Skin:    Findings: No rash.  Neurological:     Mental Status: She is alert.  Psychiatric:        Mood and Affect: Mood normal.     Procedures  Procedures  ED Course / MDM    Medical Decision Making Amount and/or Complexity of Data Reviewed Labs: ordered. Radiology: ordered.  Risk Prescription drug management. Decision regarding hospitalization.     I spoke with Natasha Reef, NP, and she said that the patient's case will likely be placed to late today or early tomorrow.  She will require admission.  Admitted to Dr. Roosevelt Conner with [facility]     Natasha Conner, [place], PA-C 05/09/22 1507    Natasha Congo, MD 05/09/22 [phone]

## 2022-05-09 NOTE — Consult Note (Signed)
Chief Complaint: Patient was seen in consultation today for  Chief Complaint  Patient presents with   Irregular Heart Beat   Eye Problem    Referring Physician(s): Bing Neighbors, MD   Supervising Physician: Baldemar Lenis  Patient Status: [facility] - ED  History of Present Illness: Natasha Conner is a 56 y.o. female with no significant past medical history.  She presented to the [facility] ED today with a chief complaint of double vision which started last night. She also stated that about a week ago she was having heart palpitations. Imaging obtained showed an irregular basilar artery with possible fenestration. EKG showed normal sinus rhythm.   CTA Head/Neck 05/09/22 IMPRESSION: CT head: No evidence of acute intracranial abnormality. CTA neck: 1. The common carotid, internal carotid and vertebral arteries are patent within the neck without stenosis or significant atherosclerotic disease. 2.  Aortic Atherosclerosis (ICD10-I70.0). CTA head 1. 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 Catheter-based angiography is recommended for further characterization. 2. No intracranial large vessel occlusion or proximal high-grade arterial stenosis.  The patient was assessed by Neurology who recommended a diagnostic cerebral angiogram for further evaluation. Neuro Interventional Radiology was consulted and Dr. Tommie Sams has approved the patient for this procedure.    History reviewed. No pertinent past medical history.  Past Surgical History:  Procedure Laterality Date   CESAREAN SECTION     GASTRIC BYPASS     HERNIA REPAIR      Allergies: Patient has no known allergies.  Medications: Prior to Admission medications   Not on File     History reviewed. No pertinent family history.  Social History   Socioeconomic History   Marital status:  Single    Spouse name: Not on file   Number of children: Not on file   Years of education: Not on file   Highest education level: Not on file  Occupational History   Not on file  Tobacco Use   Smoking status: Never   Smokeless tobacco: Never  Substance and Sexual Activity   Alcohol use: Never   Drug use: Never   Sexual activity: Not Currently  Other Topics Concern   Not on file  Social History Narrative   Not on file   Social Determinants of Health   Financial Resource Strain: Not on file  Food Insecurity: Not on file  Transportation Needs: Not on file  Physical Activity: Not on file  Stress: Not on file  Social Connections: Not on file    Review of Systems: A 12 point ROS discussed and pertinent positives are indicated in the HPI above.  All other systems are negative.  Review of Systems  Constitutional:  Negative for appetite change and fatigue.  Eyes:  Positive for visual disturbance.  Respiratory:  Negative for cough and shortness of breath.   Cardiovascular:  Negative for chest pain and leg swelling.  Gastrointestinal:  Negative for abdominal pain, diarrhea, nausea and vomiting.  Neurological:  Negative for dizziness, tremors, facial asymmetry, speech difficulty, weakness, numbness and headaches.    Vital Signs: BP 135/72 (BP Location: Right Arm)   Pulse 70   Temp 98 F (36.7 C) (Oral)   Resp 18   Ht 5' 5.5" (1.664 m)   Wt 198 lb (89.8 kg)   SpO2 100%   BMI 32.45 kg/m   Physical Exam Constitutional:      General: She is not  in acute distress.    Appearance: She is not ill-appearing.  HENT:     Mouth/Throat:     Mouth: Mucous membranes are moist.     Pharynx: Oropharynx is clear.  Cardiovascular:     Rate and Rhythm: Normal rate and regular rhythm.     Pulses: Normal pulses.     Heart sounds: Normal heart sounds.  Pulmonary:     Effort: Pulmonary effort is normal.     Breath sounds: Normal breath sounds.  Abdominal:     General: Bowel sounds are  normal.     Palpations: Abdomen is soft.  Musculoskeletal:     Right lower leg: No edema.     Left lower leg: No edema.  Skin:    General: Skin is warm and dry.  Neurological:     Mental Status: She is alert and oriented to person, place, and time.     Cranial Nerves: No dysarthria or facial asymmetry.     Motor: No weakness or tremor.     Comments: Intermittent double vision     Imaging: CT ANGIO HEAD NECK W WO CM  Result Date: 05/09/2022 CLINICAL DATA:  Diplopia. EXAM: CT ANGIOGRAPHY HEAD AND NECK TECHNIQUE: Multidetector CT imaging of the head and neck was performed using the standard protocol during bolus administration of intravenous contrast. Multiplanar CT image reconstructions and MIPs were obtained to evaluate the vascular anatomy. Carotid stenosis measurements (when applicable) are obtained utilizing NASCET criteria, using the distal internal carotid diameter as the denominator. RADIATION DOSE REDUCTION: This exam was performed according to the departmental dose-optimization program which includes automated exposure control, adjustment of the mA and/or kV according to patient size and/or use of iterative reconstruction technique. CONTRAST:  161mL OMNIPAQUE IOHEXOL 350 MG/ML SOLN COMPARISON:  Same-day brain MRI 05/09/2022. FINDINGS: CT HEAD FINDINGS Brain: Cerebral volume is normal. There is no acute intracranial hemorrhage. No demarcated cortical infarct. No extra-axial fluid collection. No evidence of an intracranial mass. No midline shift. Vascular: Hyperdense vessel.  Atherosclerotic calcifications. Skull: No fracture or aggressive osseous lesion. Sinuses/Orbits: No mass or acute finding within the imaged orbits. No significant paranasal sinus disease. Review of the MIP images confirms the above findings CTA NECK FINDINGS Aortic arch: Standard aortic branching. Mild atherosclerotic plaque within the visualized aortic arch. Streak and beam hardening artifact arising from a dense  right-sided contrast bolus partially obscures the right subclavian artery. Within this limitation, there is no appreciable hemodynamically significant innominate or proximal subclavian artery stenosis. Right carotid system: CCA and ICA patent within the neck without stenosis or significant atherosclerotic disease. Left carotid system: CCA and ICA patent within the neck without stenosis or significant atherosclerotic disease. Vertebral arteries: Vertebral arteries codominant and patent within the neck without stenosis or significant atherosclerotic disease. Skeleton: Reversal of the expected cervical lordosis. Cervical spondylosis. Most notably at C4-C5, there is advanced disc space narrowing with a central disc protrusion and bilateral uncovertebral hypertrophy. Apparent mild spinal canal stenosis at this level. Other neck: No neck mass or cervical lymphadenopathy. Upper chest: No consolidation within the imaged lung apices. Review of the MIP images confirms the above findings CTA HEAD FINDINGS Anterior circulation: The intracranial internal carotid arteries are patent. Non-stenotic calcified plaque within both vessels. The M1 middle cerebral arteries are patent. No M2 proximal branch occlusion or high-grade proximal stenosis. The anterior cerebral arteries are patent. No intracranial aneurysm is identified. Posterior circulation: The intracranial vertebral arteries are patent. Tortuous and irregular appearance of the basilar artery. Additionally,  a fenestration is questioned within the proximal basilar artery (for instance as seen on series 11, image 110). No high-grade basilar artery stenosis. The posterior cerebral arteries are patent. Mildly hypoplastic right P1 segment. Sizable posterior communicating arteries are present bilaterally. Venous sinuses: Within the limitations of contrast timing, no convincing thrombus. Anatomic variants: As described. Review of the MIP images confirms the above findings  IMPRESSION: CT head: No evidence of acute intracranial abnormality. CTA neck: 1. The common carotid, internal carotid and vertebral arteries are patent within the neck without stenosis or significant atherosclerotic disease. 2.  Aortic Atherosclerosis (ICD10-I70.0). CTA head 1. 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 Catheter-based angiography is recommended for further characterization. 2. No intracranial large vessel occlusion or proximal high-grade arterial stenosis. Electronically Signed   By: Kellie Simmering D.O.   On: 05/09/2022 10:39   MR BRAIN W WO CONTRAST  Result Date: 05/09/2022 CLINICAL DATA:  56 year old female with transient double vision. TIA. Palpitations. EXAM: MRI HEAD WITHOUT AND WITH CONTRAST TECHNIQUE: Multiplanar, multiecho pulse sequences of the brain and surrounding structures were obtained without and with intravenous contrast. CONTRAST:  62mL GADAVIST GADOBUTROL 1 MMOL/ML IV SOLN COMPARISON:  None Available. FINDINGS: Technologist notes some hair related susceptibility artifact on this exam. Left side dental hardware susceptibility artifact also. Brain: Normal cerebral volume. No restricted diffusion to suggest acute infarction. No midline shift, mass effect, evidence of mass lesion, ventriculomegaly, extra-axial collection or acute intracranial hemorrhage. Cervicomedullary junction and pituitary are within normal limits. Scattered small mostly subcortical white matter T2 and FLAIR hyperintense foci in both hemispheres are mildly to moderately advanced for age. No associated cortical encephalomalacia or chronic cerebral blood products identified. Deep gray matter nuclei, brainstem and cerebellum appear normal. No abnormal enhancement identified.  No dural thickening. Vascular: Major intracranial vascular flow voids are preserved, however, the basilar artery appears  abnormal on series 19, image 17, focally irregular and tortuous. This is also visible on postcontrast images (series 20, image 36) and does not appear directly related to a normal anatomic variation (such as persistent trigeminal artery). Although there do appear to be fetal type bilateral PCA origins. Other major intracranial vascular flow voids appear more normal. Major dural venous sinuses are enhancing and appear to be patent. Skull and upper cervical spine: Negative visible cervical spine. Visualized bone marrow signal is within normal limits. Sinuses/Orbits: Normal suprasellar cistern and optic chiasm. Orbits soft tissues appears symmetric and normal. Paranasal Visualized paranasal sinuses and mastoids are stable and well aerated. Other: Visible internal auditory structures appear normal. Grossly negative visible scalp and face. IMPRESSION: 1. 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 2. But no associated acute intracranial abnormality. Mild to moderate for age nonspecific cerebral white matter signal changes. 3. Recommend further evaluation of the Basilar Artery, and CTA is preferred since gadolinium contrast on this exam will contaminate any intracranial MRA until excreted by the kidneys. Electronically Signed   By: Genevie Ann M.D.   On: 05/09/2022 07:08    Labs:  CBC: Recent Labs    05/09/22 0002  WBC 7.3  HGB 13.4  HCT 43.0  PLT 251    COAGS: No results for input(s): "INR", "APTT" in the last 8760 hours.  BMP: Recent Labs    05/09/22 0002  NA 140  K 3.7  CL 109  CO2 26  GLUCOSE 98  BUN 15  CALCIUM  8.9  CREATININE 0.60  GFRNONAA >60    LIVER FUNCTION TESTS: Recent Labs    05/09/22 0002  BILITOT 0.5  AST 15  ALT 10  ALKPHOS 84  PROT 7.0  ALBUMIN 4.0    TUMOR MARKERS: No results for input(s): "AFPTM", "CEA", "CA199", "CHROMGRNA" in the last 8760 hours.  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 The patient is currently in the [facility] ED with plans to transfer to [facility]. The procedure was discussed in length with the patient and her son who was at the bedside.  Risks and benefits of this procedure were discussed with the patient including, but not limited to bleeding, infection, vascular injury including stroke, or contrast induced renal failure.  This interventional procedure involves the use of X-rays and because of the nature of the planned procedure, it is possible that we will have prolonged use of X-ray fluoroscopy.  Potential radiation risks to you include (but are not limited to) the following: - A slightly elevated risk for cancer  several years later in life. This risk is typically less than 0.5% percent. This risk is low in comparison to the normal incidence of human cancer, which is 33% for women and 50% for men according to the [employer]. - Radiation induced injury can include skin redness, resembling a rash, tissue breakdown / ulcers and hair loss (which can be temporary or permanent).   The likelihood of either of these occurring depends on the difficulty of the procedure and whether you are sensitive to radiation due to previous procedures, disease, or genetic conditions.   IF your procedure requires a prolonged use of radiation, you will be notified and given written instructions for further action.  It is your responsibility to monitor the irradiated area for the 2 weeks following the procedure and to notify your physician if you are concerned that you have suffered a radiation induced injury.    All of the patient's questions were answered, patient is agreeable to proceed. She has been NPO.   Consent signed and at the bedside in the ED.  Thank you for this interesting consult.  I greatly enjoyed meeting Natasha Conner and look forward to participating in their care.  A copy of this report was sent to the requesting provider on this date.  Electronically Signed: Soyla Dryer, AGACNP-BC [phone] 05/09/2022, 1:13 PM   I spent a total of 20 Minutes    in face to face in clinical consultation, greater than 50% of which was counseling/coordinating care for diagnostic cerebral angiogram

## 2022-05-09 NOTE — ED Provider Notes (Signed)
[facility]-EMERGENCY DEPT [facility] Emergency Department Provider Note MRN:  [id number]  Arrival date & time: 05/09/22     Chief Complaint   Irregular Heart Beat and Eye Problem   History of Present Illness   Natasha Conner is a 56 y.o. year-old female presents to the ED with chief complaint of double vision.  She states that the symptoms started at 8:30 PM last night.  She states that they lasted for approximately 4 to 5 hours.  The symptoms only occurred when she had both eyes open.  If she covers 1 eye, she could see fine.  She denies any numbness, weakness, tingling in her extremities.  Denies headache.  Denies fevers or chills.  Denies any speech changes.  She states that she is healthy, and only takes multivitamins.  She does see her doctor on an annual basis.  She denies any medical problems.  She states that a week or so ago she felt some palpitations and questions if this is related.  History provided by patient.   Review of Systems  Pertinent positive and negative review of systems noted in HPI.    Physical Exam   Vitals:   05/08/22 2349 05/09/22 0139  BP: (!) 152/75 122/72  Pulse: 62 (!) 55  Resp: 16 17  Temp: 98.5 F (36.9 C) 97.8 F (36.6 C)  SpO2: 97% 100%    CONSTITUTIONAL:  well-appearing, NAD NEURO:  Alert and oriented x 3, CN 3-12 grossly intact EYES:  eyes equal and reactive ENT/NECK:  Supple, no stridor  CARDIO:  normal rate, regular rhythm, appears well-perfused  PULM:  No respiratory distress, CTAB GI/GU:  non-distended,  MSK/SPINE:  No gross deformities, no edema, moves all extremities  SKIN:  no rash, atraumatic   *Additional and/or pertinent findings included in MDM below  Diagnostic and Interventional Summary    EKG Interpretation  Date/Time:  Monday May 09 2022 00:09:24 EDT Ventricular Rate:  63 PR Interval:  207 QRS Duration: 89 QT Interval:  409 QTC Calculation: 419 R Axis:   63 Text Interpretation: Sinus rhythm Borderline  prolonged PR interval Confirmed by Tilden Fossa [phone]) on 05/09/2022 12:13:46 AM       Labs Reviewed  CBC WITH DIFFERENTIAL/PLATELET - Abnormal; Notable for the following components:      Result Value   RBC 5.17 (*)    MCH 25.9 (*)    All other components within normal limits  COMPREHENSIVE METABOLIC PANEL    MR BRAIN W WO CONTRAST    (Results Pending)    Medications - No data to display   Procedures  /  Critical Care Procedures  ED Course and Medical Decision Making  I have reviewed the triage vital signs, the nursing notes, and pertinent available records from the EMR.  Social Determinants Affecting Complexity of Care: Patient has no clinically significant social determinants affecting this chief complaint..   ED Course:    Medical Decision Making Patient here after having an episode of double vision last night.  Symptoms have resolved.  No extremities weakness.  No speech changes.  She is otherwise healthy.  Denies any complaints now.  ABCD2 score is 3 based on triage BP and duration of symptoms. Low short term risk of stroke.  Case discussed with Dr. Wilford Corner, who recommends MRI brain w/wo.  Amount and/or Complexity of Data Reviewed Labs: ordered.    Details: No leukocytosis Normal electrolytes Radiology: ordered.    Details: After discussion with neurology, MRI w/wo ordered. ECG/medicine tests: independent interpretation  performed.    Details: NSR  Risk Decision regarding hospitalization.     Consultants: I discussed the case with Dr. Rory Percy, who recommends MRI brain w/wo.  If negative, patient can be discharged.   Treatment and Plan: Patient signed out to oncoming team. Plan: Follow-up on MRI.    Final Clinical Impressions(s) / ED Diagnoses  No diagnosis found.  ED Discharge Orders     None         Discharge Instructions Discussed with and Provided to Patient:   Discharge Instructions   None      Montine Circle, PA-C 05/09/22  2423    Quintella Reichert, MD 05/09/22 [phone]

## 2022-05-10 ENCOUNTER — Observation Stay (HOSPITAL_COMMUNITY): Payer: 59

## 2022-05-10 DIAGNOSIS — G459 Transient cerebral ischemic attack, unspecified: Secondary | ICD-10-CM | POA: Diagnosis not present

## 2022-05-10 DIAGNOSIS — H532 Diplopia: Secondary | ICD-10-CM | POA: Diagnosis not present

## 2022-05-10 HISTORY — PX: IR ANGIO INTRA EXTRACRAN SEL INTERNAL CAROTID BILAT MOD SED: IMG5363

## 2022-05-10 HISTORY — PX: IR ANGIO VERTEBRAL SEL VERTEBRAL UNI L MOD SED: IMG5367

## 2022-05-10 HISTORY — PX: IR US GUIDE VASC ACCESS RIGHT: IMG2390

## 2022-05-10 LAB — LIPID PANEL
Cholesterol: 201 mg/dL — ABNORMAL HIGH (ref 0–200)
HDL: 67 mg/dL (ref >40)
LDL Cholesterol: 129 mg/dL — ABNORMAL HIGH (ref 0–99)
Total CHOL/HDL Ratio: 3 RATIO
Triglycerides: 25 mg/dL (ref <150)
VLDL: 5 mg/dL (ref 0–40)

## 2022-05-10 MED ORDER — ATORVASTATIN CALCIUM 10 MG PO TABS
20.0000 mg | ORAL_TABLET | Freq: Every day | ORAL | Status: DC
  Administered 2022-05-10 – 2022-05-11 (×2): 20 mg via ORAL
  Filled 2022-05-10 (×2): qty 2

## 2022-05-10 MED ORDER — IOHEXOL 300 MG/ML  SOLN
100.0000 mL | Freq: Once | INTRAMUSCULAR | Status: AC | PRN
  Administered 2022-05-10: 21 mL via INTRA_ARTERIAL

## 2022-05-10 MED ORDER — MIDAZOLAM HCL 2 MG/2ML IJ SOLN
INTRAMUSCULAR | Status: AC | PRN
  Administered 2022-05-10: 1 mg via INTRAVENOUS

## 2022-05-10 MED ORDER — IOHEXOL 300 MG/ML  SOLN
100.0000 mL | Freq: Once | INTRAMUSCULAR | Status: AC | PRN
  Administered 2022-05-10: 60 mL via INTRA_ARTERIAL

## 2022-05-10 MED ORDER — FENTANYL CITRATE (PF) 100 MCG/2ML IJ SOLN
INTRAMUSCULAR | Status: AC | PRN
  Administered 2022-05-10 (×3): 25 ug via INTRAVENOUS

## 2022-05-10 MED ORDER — FENTANYL CITRATE (PF) 100 MCG/2ML IJ SOLN
INTRAMUSCULAR | Status: AC
  Filled 2022-05-10: qty 4

## 2022-05-10 MED ORDER — LIDOCAINE HCL 1 % IJ SOLN
INTRAMUSCULAR | Status: AC
  Filled 2022-05-10: qty 20

## 2022-05-10 MED ORDER — ASPIRIN 81 MG PO CHEW
81.0000 mg | CHEWABLE_TABLET | Freq: Every day | ORAL | Status: DC
  Administered 2022-05-10 – 2022-05-11 (×2): 81 mg via ORAL
  Filled 2022-05-10 (×2): qty 1

## 2022-05-10 MED ORDER — MIDAZOLAM HCL 2 MG/2ML IJ SOLN
INTRAMUSCULAR | Status: AC
  Filled 2022-05-10: qty 4

## 2022-05-10 NOTE — Plan of Care (Signed)
  Problem: Education: Goal: Knowledge of General Education information will improve Description: Including pain rating scale, medication(s)/side effects and non-pharmacologic comfort measures 05/10/2022 0410 by Lorna Dibble, RN Outcome: Progressing 05/09/2022 2312 by Lorna Dibble, RN Outcome: Progressing   Problem: Health Behavior/Discharge Planning: Goal: Ability to manage health-related needs will improve 05/10/2022 0410 by Lorna Dibble, RN Outcome: Progressing 05/09/2022 2312 by Lorna Dibble, RN Outcome: Progressing   Problem: Clinical Measurements: Goal: Ability to maintain clinical measurements within normal limits will improve 05/10/2022 0410 by Lorna Dibble, RN Outcome: Progressing 05/09/2022 2312 by Lorna Dibble, RN Outcome: Progressing Goal: Will remain free from infection 05/10/2022 0410 by Lorna Dibble, RN Outcome: Progressing 05/09/2022 2312 by Lorna Dibble, RN Outcome: Progressing Goal: Diagnostic test results will improve 05/10/2022 0410 by Lorna Dibble, RN Outcome: Progressing 05/09/2022 2312 by Lorna Dibble, RN Outcome: Progressing Goal: Respiratory complications will improve 05/10/2022 0410 by Lorna Dibble, RN Outcome: Progressing 05/09/2022 2312 by Lorna Dibble, RN Outcome: Progressing Goal: Cardiovascular complication will be avoided 05/10/2022 0410 by Lorna Dibble, RN Outcome: Progressing 05/09/2022 2312 by Lorna Dibble, RN Outcome: Progressing   Problem: Activity: Goal: Risk for activity intolerance will decrease 05/10/2022 0410 by Lorna Dibble, RN Outcome: Progressing 05/09/2022 2312 by Lorna Dibble, RN Outcome: Progressing   Problem: Nutrition: Goal: Adequate nutrition will be maintained 05/10/2022 0410 by Lorna Dibble, RN Outcome: Progressing 05/09/2022 2312 by Lorna Dibble, RN Outcome: Progressing   Problem:  Coping: Goal: Level of anxiety will decrease 05/10/2022 0410 by Lorna Dibble, RN Outcome: Progressing 05/09/2022 2312 by Lorna Dibble, RN Outcome: Progressing   Problem: Elimination: Goal: Will not experience complications related to bowel motility 05/10/2022 0410 by Lorna Dibble, RN Outcome: Progressing 05/09/2022 2312 by Lorna Dibble, RN Outcome: Progressing Goal: Will not experience complications related to urinary retention 05/10/2022 0410 by Lorna Dibble, RN Outcome: Progressing 05/09/2022 2312 by Lorna Dibble, RN Outcome: Progressing   Problem: Pain Managment: Goal: General experience of comfort will improve 05/10/2022 0410 by Lorna Dibble, RN Outcome: Progressing 05/09/2022 2312 by Lorna Dibble, RN Outcome: Progressing   Problem: Safety: Goal: Ability to remain free from injury will improve 05/10/2022 0410 by Lorna Dibble, RN Outcome: Progressing 05/09/2022 2312 by Lorna Dibble, RN Outcome: Progressing   Problem: Skin Integrity: Goal: Risk for impaired skin integrity will decrease 05/10/2022 0410 by Lorna Dibble, RN Outcome: Progressing 05/09/2022 2312 by Lorna Dibble, RN Outcome: Progressing   Problem: Education: Goal: Knowledge of disease or condition will improve Outcome: Progressing Goal: Knowledge of secondary prevention will improve (SELECT ALL) Outcome: Progressing Goal: Knowledge of patient specific risk factors will improve (INDIVIDUALIZE FOR PATIENT) Outcome: Progressing Goal: Individualized Educational Video(s) Outcome: Progressing   Problem: Coping: Goal: Will verbalize positive feelings about self Outcome: Progressing Goal: Will identify appropriate support needs Outcome: Progressing   Problem: Health Behavior/Discharge Planning: Goal: Ability to manage health-related needs will improve Outcome: Progressing   Problem: Self-Care: Goal: Ability to  participate in self-care as condition permits will improve Outcome: Progressing   Problem: Nutrition: Goal: Risk of aspiration will decrease Outcome: Progressing   Problem: Intracerebral Hemorrhage Tissue Perfusion: Goal: Complications of Intracerebral Hemorrhage will be minimized Outcome: Progressing

## 2022-05-10 NOTE — Discharge Summary (Addendum)
STROKE TEAM PROGRESS NOTE   INTERVAL HISTORY Patient evaluated at bedside, son is present. States that she began to experience double vision Sunday night (9/24) around 8-8:30PM that has improved since then. Denies any previous history of this, denies associated sxs such as headaches, vertigo, nausea, numbness, tingling, or loss of vision. Denies any hx of trauma. No acute findings on physical exam or imaging.  Denies history of any hypertension or diabetes.  Denies any eye pain, photophobia or painful eye movements. 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   Vitals:   05/10/22 1410 05/10/22 1415 05/10/22 1437 05/10/22 1546  BP: 114/63 119/67 130/64 112/74  Pulse: 64 64  60  Resp: 16 16 16 16   Temp:   97.9 F (36.6 C)   TempSrc:   Oral   SpO2: 100% 99% 100% 100%  Weight:      Height:       CBC:  Recent Labs  Lab 05/09/22 0002  WBC 7.3  NEUTROABS 4.5  HGB 13.4  HCT 43.0  MCV 83.2  PLT 106   Basic Metabolic Panel:  Recent Labs  Lab 05/09/22 0002  NA 140  K 3.7  CL 109  CO2 26  GLUCOSE 98  BUN 15  CREATININE 0.60  CALCIUM 8.9   Lipid Panel:  Recent Labs  Lab 05/10/22 0611  CHOL 201*  TRIG 25  HDL 67  CHOLHDL 3.0  VLDL 5  LDLCALC 129*   HgbA1c:  Recent Labs  Lab 05/09/22 1907  HGBA1C 4.9   Urine Drug Screen: No results for input(s): "LABOPIA", "COCAINSCRNUR", "LABBENZ", "AMPHETMU", "THCU", "LABBARB" in the last 168 hours.  Alcohol Level No results for input(s): "ETH" in the last 168 hours.  IMAGING past 24 hours No results found.  --PHYSICAL EXAM-- Constitutional: In no acute distress HEENT: Normocephalic, Normal conjunctiva, anicteric. Hearing grossly intact. No nasal discharge. Neck: Neck is supple. No masses or thyromegaly. Resp: Respirations are non-labored. No wheezing. Skin: Warm. No rashes or  ulcers.  Neuro Mental Status AAOx3; speech fluent; language and comprehension intact; long term memory intact; concentration and attention intact  Cranial Nerves II: Nor abnormalities in visual fields. III, IV, VI: Conjugate gaze. EOM intact, no gaze preference or deviation, no nystagmus  V: Normal sensation in V1, V2, and V3 segments bilaterally  VII: Facial symmetry at rest and during various facial expressions VIII: Normal hearing to speech  IX, X: Normal palatal elevation, no uvular deviation  XI: 5/5 head turn and 5/5 shoulder shrug bilaterally XII: Symmetric tongue movement, tongue is midline without atrophy or fasciculations.   Motor Strength R UE: 5/5 L UE: 5/5 R LE: 5/5 L LE: 5/5  Sensory  Intact sensation of UE and LE bilaterally.   Cerebellar: FNF:WNL Heel-to-Shin Test: WNL   ASSESSMENT/PLAN Ms. Natasha Conner is a 56 y.o. female with no significant medical history presented with double vision. CT head negative, MRI with and without contrast showed abnormal mild basilar artery appears to be torturous irregular in the prepontine cistern.    Transient binocular diplopia of unclear etiology Head/Neck CT: No intracranial large vessel occlusion or proximal high-grade arterial stenosis. 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 No flow limitation, aneurysm or evidence of dissection. 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  No acute findings.  2D Echo:  LDL 129 HgbA1c 4.9 VTE prophylaxis - Lovenox    Diet   Diet regular Room service appropriate? Yes; Fluid consistency: Thin   No antithrombotic prior to admission, now on aspirin 81 mg daily Therapy recommendations:  pending Disposition:  pending, clear from neurological standpoint   Hyperlipidemia Home  meds:  none LDL 129, goal < 70 Start Lipitor 20 mg  Continue statin at discharge   Other Stroke Risk Factors Obesity, Body mass index is 32.45 kg/m., BMI >/= 30 associated with increased stroke risk, recommend weight loss, diet and exercise as appropriate    Hospital day # 0  Lorri Frederick, MD PGY-1   I have personally obtained history,examined this patient, reviewed notes, independently viewed imaging studies, participated in medical decision making and plan of care.ROS completed by me personally and pertinent positives fully documented  I have made any additions or clarifications directly to the above note. Agree with note above.  Patient presented with 2-day history of transient binocular diplopia without any associated neurological symptoms.  Neurological exam was normal.  She does not have history of any diabetes or hypertension or any other ocular symptoms.  MRI scan is unremarkable and CT angiogram suggested unusual appearance of basilar artery prompting diagnosis cerebral catheter angiogram which shows severe tortuosity of proximal basilar artery with preserved distal flow without limiting stenosis or aneurysm.  I doubt her transient diplopia is related to her angiogram findings.  Long discussion with patient and her son at the bedside and answered questions.  Continue aspirin and Lipitor for stroke prevention.  Follow-up as an outpatient in the clinic if she has recurrent or new neurological symptoms.  Discussed with Dr. Sharl Ma.  Greater than 50% time during this 50-minute visit was spent in counseling and coordination of care about her diplopia and discussion of findings of angiogram and answering questions.Natasha Heady, MD Medical Director [facility] Pager: [phone] 05/10/2022 5:40 PM  To contact Stroke Continuity provider, please refer to [email or web address]. After hours, contact General Neurology

## 2022-05-10 NOTE — Procedures (Signed)
INTERVENTIONAL NEURORADIOLOGY BRIEF POSTPROCEDURE NOTE  DIAGNOSTIC CEREBRAL ANGIOGRAM  Attending: Dr. 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  Complications: None.  Estimated blood loss: None.  Specimen: None.  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 No flow limitation, aneurysm or evidence of dissection.  The patient tolerated the procedure well without incident or complication and is in stable condition.

## 2022-05-10 NOTE — Progress Notes (Signed)
PT Cancellation Note  Patient Details Name: Natasha Conner MRN: [id number] DOB: 05/31/1966   Cancelled Treatment:    Reason Eval/Treat Not Completed: Patient at procedure or test/unavailable. Pt at IR for angiogram. PT to return as able to complete PT evaluation  [name], PT, DPT Acute Rehabilitation Services Secure chat preferred Office #: [phone]    Berline Lopes 05/10/2022, 1:35 PM

## 2022-05-10 NOTE — Progress Notes (Signed)
Echo attempted. Patient in IR procedure. Will attempt again as schedule permits.

## 2022-05-10 NOTE — Progress Notes (Signed)
[facility]  PROGRESS NOTE  Natasha Conner MLY:[id number] DOB: [birth year] DOA: 05/08/2022 PCP: Kathyrn Lass, MD   Brief HPI:   56 year old female with no significant medical problems presented with double vision.  Patient started having blurry vision and double vision yesterday evening after dinner, denied numbness, weakness of limbs, no hearing changes, no headache.  Patient came to ED after 3 hours when the symptoms did not resolve by itself. In the ED CT head was negative, MRI brain showed abnormal mid basilar artery which appeared tortuous and irregular in the prepontine cistern.  It showed no associated acute intracranial abnormality.  Recommended further evaluation of the basilar artery.  CTA head showed tortuous and irregular appearance of the basilar artery, basilar artery fenestration question.  Catheter-based angiography recommended for further catheterization. Neurology was consulted, recommended to get IR for cerebral angiogram   Subjective   Patient seen and examined, double vision has resolved.  Patient underwent diagnostic cerebral angiogram which showed short segment of severe tortuosity of the proximal basilar artery and proximal left AICA.  No flow limitation, aneurysm or evidence of dissection.   Assessment/Plan:    Diplopia -Resolved -Unclear etiology -Cerebral angiogram was unremarkable -MRI brain negative for stroke -?  TIA; neurology following -Continue aspirin, Lipitor -We will follow neurology recommendations     Medications     enoxaparin (LOVENOX) injection  40 mg Subcutaneous Q24H   fentaNYL       lidocaine       midazolam         Data Reviewed:   CBG:  No results for input(s): "GLUCAP" in the last 168 hours.  SpO2: 100 % O2 Flow Rate (L/min): 2 L/min    Vitals:   05/10/22 1410 05/10/22 1415 05/10/22 1437 05/10/22 1546  BP: 114/63 119/67 130/64 112/74  Pulse: 64 64  60  Resp: 16 16 16 16   Temp:   97.9 F (36.6 C)   TempSrc:    Oral   SpO2: 100% 99% 100% 100%  Weight:      Height:          Data Reviewed:  Basic Metabolic Panel: Recent Labs  Lab 05/09/22 0002  NA 140  K 3.7  CL 109  CO2 26  GLUCOSE 98  BUN 15  CREATININE 0.60  CALCIUM 8.9    CBC: Recent Labs  Lab 05/09/22 0002  WBC 7.3  NEUTROABS 4.5  HGB 13.4  HCT 43.0  MCV 83.2  PLT 251    LFT Recent Labs  Lab 05/09/22 0002  AST 15  ALT 10  ALKPHOS 84  BILITOT 0.5  PROT 7.0  ALBUMIN 4.0     Antibiotics: Anti-infectives (From admission, onward)    None        DVT prophylaxis: Lovenox  Code Status: Full code  Family Communication: Discussed with patient's son at bedside   CONSULTS neurology   Objective    Physical Examination:   General-appears in no acute distress Heart-S1-S2, regular, no murmur auscultated Lungs-clear to auscultation bilaterally, no wheezing or crackles auscultated Abdomen-soft, nontender, no organomegaly Extremities-no edema in the lower extremities Neuro-alert, oriented x3, no focal deficit noted  Status is: Inpatient:          Oswald Hillock   [facility] If 7PM-7AM, please contact night-coverage at [email or web address], Office  [phone]   05/10/2022, 4:42 PM  LOS: 0 days

## 2022-05-10 NOTE — Progress Notes (Signed)
Echo attempted. Doctor in room with patient. Will attempt again as schedule permits.

## 2022-05-11 ENCOUNTER — Observation Stay (HOSPITAL_BASED_OUTPATIENT_CLINIC_OR_DEPARTMENT_OTHER): Payer: 59

## 2022-05-11 DIAGNOSIS — G459 Transient cerebral ischemic attack, unspecified: Secondary | ICD-10-CM

## 2022-05-11 DIAGNOSIS — H532 Diplopia: Secondary | ICD-10-CM | POA: Diagnosis not present

## 2022-05-11 LAB — ECHOCARDIOGRAM COMPLETE
AR max vel: 2.71 cm2
AV Peak grad: 10.4 mmHg
Ao pk vel: 1.61 m/s
Area-P 1/2: 4.06 cm2
Height: 65.5 in
S' Lateral: 2.8 cm
Weight: 3168 oz

## 2022-05-11 MED ORDER — ATORVASTATIN CALCIUM 20 MG PO TABS: 20.0000 mg | ORAL_TABLET | Freq: Every day | ORAL | 2 refills | Status: AC

## 2022-05-11 MED ORDER — ASPIRIN 81 MG PO CHEW: 81.0000 mg | CHEWABLE_TABLET | Freq: Every day | ORAL | 3 refills | Status: AC

## 2022-05-11 NOTE — Plan of Care (Signed)
  Problem: Education: Goal: Knowledge of General Education information will improve Description: Including pain rating scale, medication(s)/side effects and non-pharmacologic comfort measures Outcome: Progressing   Problem: Health Behavior/Discharge Planning: Goal: Ability to manage health-related needs will improve Outcome: Progressing   Problem: Clinical Measurements: Goal: Ability to maintain clinical measurements within normal limits will improve Outcome: Progressing Goal: Will remain free from infection Outcome: Progressing Goal: Diagnostic test results will improve Outcome: Progressing Goal: Respiratory complications will improve Outcome: Progressing Goal: Cardiovascular complication will be avoided Outcome: Progressing   Problem: Activity: Goal: Risk for activity intolerance will decrease Outcome: Progressing   Problem: Nutrition: Goal: Adequate nutrition will be maintained Outcome: Progressing   Problem: Coping: Goal: Level of anxiety will decrease Outcome: Progressing   Problem: Elimination: Goal: Will not experience complications related to bowel motility Outcome: Progressing Goal: Will not experience complications related to urinary retention Outcome: Progressing   Problem: Pain Managment: Goal: General experience of comfort will improve Outcome: Progressing   Problem: Safety: Goal: Ability to remain free from injury will improve Outcome: Progressing   Problem: Skin Integrity: Goal: Risk for impaired skin integrity will decrease Outcome: Progressing   Problem: Education: Goal: Knowledge of disease or condition will improve Outcome: Progressing Goal: Knowledge of secondary prevention will improve (SELECT ALL) Outcome: Progressing Goal: Knowledge of patient specific risk factors will improve (INDIVIDUALIZE FOR PATIENT) Outcome: Progressing Goal: Individualized Educational Video(s) Outcome: Progressing   Problem: Coping: Goal: Will verbalize  positive feelings about self Outcome: Progressing Goal: Will identify appropriate support needs Outcome: Progressing   Problem: Health Behavior/Discharge Planning: Goal: Ability to manage health-related needs will improve Outcome: Progressing   Problem: Self-Care: Goal: Ability to participate in self-care as condition permits will improve Outcome: Progressing   Problem: Nutrition: Goal: Risk of aspiration will decrease Outcome: Progressing   Problem: Intracerebral Hemorrhage Tissue Perfusion: Goal: Complications of Intracerebral Hemorrhage will be minimized Outcome: Progressing   Problem: Education: Goal: Understanding of CV disease, CV risk reduction, and recovery process will improve Outcome: Progressing Goal: Individualized Educational Video(s) Outcome: Progressing   Problem: Activity: Goal: Ability to return to baseline activity level will improve Outcome: Progressing   Problem: Cardiovascular: Goal: Ability to achieve and maintain adequate cardiovascular perfusion will improve Outcome: Progressing Goal: Vascular access site(s) Level 0-1 will be maintained Outcome: Progressing   Problem: Health Behavior/Discharge Planning: Goal: Ability to safely manage health-related needs after discharge will improve Outcome: Progressing

## 2022-05-11 NOTE — Discharge Summary (Signed)
Physician Discharge Summary   Patient: Natasha Conner MRN: [id number] DOB: 11-03-1965  Admit date:     05/08/2022  Discharge date: 05/11/22  Discharge Physician: Meredeth Ide   PCP: Sigmund Hazel, MD   Recommendations at discharge:   Follow-up PCP in 1 week Follow-up neurology as needed Follow-up with ophthalmology as outpatient  Discharge Diagnoses: Principal Problem:   TIA (transient ischemic attack) Active Problems:   Diplopia  Resolved Problems:   * No resolved hospital problems. *  Hospital Course:  56 year old female with no significant medical problems presented with double vision.  Patient started having blurry vision and double vision yesterday evening after dinner, denied numbness, weakness of limbs, no hearing changes, no headache.  Patient came to ED after 3 hours when the symptoms did not resolve by itself. In the ED CT head was negative, MRI brain showed abnormal mid basilar artery which appeared tortuous and irregular in the prepontine cistern.  It showed no associated acute intracranial abnormality.  Recommended further evaluation of the basilar artery.  CTA head showed tortuous and irregular appearance of the basilar artery, basilar artery fenestration question.  Catheter-based angiography recommended for further catheterization. Neurology was consulted, recommended to get IR for cerebral angiogram  Assessment and Plan:   Diplopia -Resolved -Unclear etiology -Cerebral angiogram was unremarkable -MRI brain negative for stroke -Neurology has recommended to discharge patient on aspirin and Lipitor -Follow-up neurology as needed -Would recommend to follow-up with ophthalmology as outpatient  Hyperlipidemia -Total cholesterol 201, LDL 129 -Started on Lipitor 20 mg daily      Consultants: Neurology Procedures performed: Cerebral angiogram Disposition: Home Diet recommendation:  Discharge Diet Orders (From admission, onward)     Start     Ordered    05/11/22 0000  Diet - low sodium heart healthy        05/11/22 1113           Cardiac diet DISCHARGE MEDICATION: Allergies as of 05/11/2022   No Known Allergies      Medication List     TAKE these medications    aspirin 81 MG chewable tablet Chew 1 tablet (81 mg total) by mouth daily. Start taking on: May 12, 2022   atorvastatin 20 MG tablet Commonly known as: LIPITOR Take 1 tablet (20 mg total) by mouth daily. Start taking on: May 12, 2022   CALCIUM PO Take 1 tablet by mouth daily with breakfast.   FerrouSul 325 (65 FE) MG tablet Generic drug: ferrous sulfate Take 325 mg by mouth daily with breakfast.   VITAMIN B12 PO Take 1 tablet by mouth daily with breakfast.   VITAMIN C PO Take 1 tablet by mouth daily.   VITAMIN D3 PO Take 1 capsule by mouth daily with breakfast.        Follow-up Information     Sigmund Hazel, MD Follow up in 1 week(s).   Specialty: Family Medicine Contact information: [address] [place] [address] [phone]         Micki Riley, MD Follow up.   Specialties: Neurology, Radiology Why: As needed Contact information: [address] [place] [address] [phone]                Discharge Exam: Ceasar Mons Weights   05/08/22 2356  Weight: 89.8 kg   General-appears in no acute distress Heart-S1-S2, regular, no murmur auscultated Lungs-clear to auscultation bilaterally, no wheezing or crackles auscultated Abdomen-soft, nontender, no organomegaly Extremities-no edema in the lower extremities Neuro-alert, oriented  x3, no focal deficit noted  Condition at discharge: good  The results of significant diagnostics from this hospitalization (including imaging, microbiology, ancillary and laboratory) are listed below for reference.   Imaging Studies: CT ANGIO HEAD NECK W WO CM  Result Date: 05/09/2022 CLINICAL DATA:  Diplopia. EXAM: CT ANGIOGRAPHY HEAD AND NECK TECHNIQUE:  Multidetector CT imaging of the head and neck was performed using the standard protocol during bolus administration of intravenous contrast. Multiplanar CT image reconstructions and MIPs were obtained to evaluate the vascular anatomy. Carotid stenosis measurements (when applicable) are obtained utilizing NASCET criteria, using the distal internal carotid diameter as the denominator. RADIATION DOSE REDUCTION: This exam was performed according to the departmental dose-optimization program which includes automated exposure control, adjustment of the mA and/or kV according to patient size and/or use of iterative reconstruction technique. CONTRAST:  143mL OMNIPAQUE IOHEXOL 350 MG/ML SOLN COMPARISON:  Same-day brain MRI 05/09/2022. FINDINGS: CT HEAD FINDINGS Brain: Cerebral volume is normal. There is no acute intracranial hemorrhage. No demarcated cortical infarct. No extra-axial fluid collection. No evidence of an intracranial mass. No midline shift. Vascular: Hyperdense vessel.  Atherosclerotic calcifications. Skull: No fracture or aggressive osseous lesion. Sinuses/Orbits: No mass or acute finding within the imaged orbits. No significant paranasal sinus disease. Review of the MIP images confirms the above findings CTA NECK FINDINGS Aortic arch: Standard aortic branching. Mild atherosclerotic plaque within the visualized aortic arch. Streak and beam hardening artifact arising from a dense right-sided contrast bolus partially obscures the right subclavian artery. Within this limitation, there is no appreciable hemodynamically significant innominate or proximal subclavian artery stenosis. Right carotid system: CCA and ICA patent within the neck without stenosis or significant atherosclerotic disease. Left carotid system: CCA and ICA patent within the neck without stenosis or significant atherosclerotic disease. Vertebral arteries: Vertebral arteries codominant and patent within the neck without stenosis or significant  atherosclerotic disease. Skeleton: Reversal of the expected cervical lordosis. Cervical spondylosis. Most notably at C4-C5, there is advanced disc space narrowing with a central disc protrusion and bilateral uncovertebral hypertrophy. Apparent mild spinal canal stenosis at this level. Other neck: No neck mass or cervical lymphadenopathy. Upper chest: No consolidation within the imaged lung apices. Review of the MIP images confirms the above findings CTA HEAD FINDINGS Anterior circulation: The intracranial internal carotid arteries are patent. Non-stenotic calcified plaque within both vessels. The M1 middle cerebral arteries are patent. No M2 proximal branch occlusion or high-grade proximal stenosis. The anterior cerebral arteries are patent. No intracranial aneurysm is identified. Posterior circulation: The intracranial vertebral arteries are patent. Tortuous and irregular appearance of the basilar artery. Additionally, a fenestration is questioned within the proximal basilar artery (for instance as seen on series 11, image 110). No high-grade basilar artery stenosis. The posterior cerebral arteries are patent. Mildly hypoplastic right P1 segment. Sizable posterior communicating arteries are present bilaterally. Venous sinuses: Within the limitations of contrast timing, no convincing thrombus. Anatomic variants: As described. Review of the MIP images confirms the above findings IMPRESSION: CT head: No evidence of acute intracranial abnormality. CTA neck: 1. The common carotid, internal carotid and vertebral arteries are patent within the neck without stenosis or significant atherosclerotic disease. 2.  Aortic Atherosclerosis (ICD10-I70.0). CTA head 1. 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 Catheter-based angiography is recommended for further characterization. 2. No intracranial large vessel  occlusion or proximal high-grade arterial stenosis. Electronically Signed   By: Kellie Simmering  D.O.   On: 05/09/2022 10:39   MR BRAIN W WO CONTRAST  Result Date: 05/09/2022 CLINICAL DATA:  56 year old female with transient double vision. TIA. Palpitations. EXAM: MRI HEAD WITHOUT AND WITH CONTRAST TECHNIQUE: Multiplanar, multiecho pulse sequences of the brain and surrounding structures were obtained without and with intravenous contrast. CONTRAST:  3mL GADAVIST GADOBUTROL 1 MMOL/ML IV SOLN COMPARISON:  None Available. FINDINGS: Technologist notes some hair related susceptibility artifact on this exam. Left side dental hardware susceptibility artifact also. Brain: Normal cerebral volume. No restricted diffusion to suggest acute infarction. No midline shift, mass effect, evidence of mass lesion, ventriculomegaly, extra-axial collection or acute intracranial hemorrhage. Cervicomedullary junction and pituitary are within normal limits. Scattered small mostly subcortical white matter T2 and FLAIR hyperintense foci in both hemispheres are mildly to moderately advanced for age. No associated cortical encephalomalacia or chronic cerebral blood products identified. Deep gray matter nuclei, brainstem and cerebellum appear normal. No abnormal enhancement identified.  No dural thickening. Vascular: Major intracranial vascular flow voids are preserved, however, the basilar artery appears abnormal on series 19, image 17, focally irregular and tortuous. This is also visible on postcontrast images (series 20, image 36) and does not appear directly related to a normal anatomic variation (such as persistent trigeminal artery). Although there do appear to be fetal type bilateral PCA origins. Other major intracranial vascular flow voids appear more normal. Major dural venous sinuses are enhancing and appear to be patent. Skull and upper cervical spine: Negative visible cervical spine. Visualized bone marrow signal is within normal  limits. Sinuses/Orbits: Normal suprasellar cistern and optic chiasm. Orbits soft tissues appears symmetric and normal. Paranasal Visualized paranasal sinuses and mastoids are stable and well aerated. Other: Visible internal auditory structures appear normal. Grossly negative visible scalp and face. IMPRESSION: 1. 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 2. But no associated acute intracranial abnormality. Mild to moderate for age nonspecific cerebral white matter signal changes. 3. Recommend further evaluation of the Basilar Artery, and CTA is preferred since gadolinium contrast on this exam will contaminate any intracranial MRA until excreted by the kidneys. Electronically Signed   By: Odessa Fleming M.D.   On: 05/09/2022 07:08    Microbiology: No results found for this or any previous visit.  Labs: CBC: Recent Labs  Lab 05/09/22 0002  WBC 7.3  NEUTROABS 4.5  HGB 13.4  HCT 43.0  MCV 83.2  PLT 251   Basic Metabolic Panel: Recent Labs  Lab 05/09/22 0002  NA 140  K 3.7  CL 109  CO2 26  GLUCOSE 98  BUN 15  CREATININE 0.60  CALCIUM 8.9   Liver Function Tests: Recent Labs  Lab 05/09/22 0002  AST 15  ALT 10  ALKPHOS 84  BILITOT 0.5  PROT 7.0  ALBUMIN 4.0   CBG: No results for input(s): "GLUCAP" in the last 168 hours.  Discharge time spent: greater than 30 minutes.  Signed: Meredeth Ide, MD [facility] 05/11/2022

## 2022-05-11 NOTE — Evaluation (Signed)
Physical Therapy Evaluation and DISCHARGE Patient Details Name: Natasha Conner MRN: [id number] DOB: [birth year] Today's Date: 05/11/2022  History of Present Illness  Natasha Conner is a 56 y.o. female with no significant medical history presented with double vision.  Clinical Impression  Pt admitted with above. Symptoms have mostly resolved. Pt functioning at baseline and will have 24/7 supervision as son works from home. Pt functioning indep and reports "I think everything looks normal now." Pt at minimal fall risk as noted by DGI score of 23/24. Pt with no further acute PT needs. PT SIGNING OFF. Please re-consult if needed in future.       Recommendations for follow up therapy are one component of a multi-disciplinary discharge planning process, led by the attending physician.  Recommendations may be updated based on patient status, additional functional criteria and insurance authorization.  Follow Up Recommendations No PT follow up      Assistance Recommended at Discharge PRN  Patient can return home with the following       Equipment Recommendations None recommended by PT  Recommendations for Other Services       Functional Status Assessment Patient has had a recent decline in their functional status and demonstrates the ability to make significant improvements in function in a reasonable and predictable amount of time.     Precautions / Restrictions Precautions Precautions: Other (comment) Precaution Comments: mild blurred vision Restrictions Weight Bearing Restrictions: No      Mobility  Bed Mobility Overal bed mobility: Modified Independent             General bed mobility comments: HOB elevated    Transfers Overall transfer level: Independent Equipment used: None               General transfer comment: no difficulty    Ambulation/Gait Ambulation/Gait assistance: Independent Gait Distance (Feet): 250 Feet Assistive device: None Gait  Pattern/deviations: WFL(Within Functional Limits) Gait velocity: wfl Gait velocity interpretation: >2.62 ft/sec, indicative of community ambulatory   General Gait Details: pt initially guarded/cautious but quickly became comfortable and returned to her baseline walking speed and pattern  Stairs Stairs: Yes Stairs assistance: Min guard Stair Management: One rail Right, Alternating pattern, Step to pattern, Forwards Number of Stairs: 12 General stair comments: pt ascended reciprocally without difficulty, pt more guarded/cautious with descending, suspect due to impaired depth perception due to blurred vision, pt initially used L HR with both hands and step to pattern and then progressed to reciprocal pattern the last 3 steps with unilateral support on L HR  Wheelchair Mobility    Modified Rankin (Stroke Patients Only) Modified Rankin (Stroke Patients Only) Pre-Morbid Rankin Score: No symptoms Modified Rankin: No significant disability     Balance Overall balance assessment: No apparent balance deficits (not formally assessed)                               Standardized Balance Assessment Standardized Balance Assessment : Dynamic Gait Index   Dynamic Gait Index Level Surface: Normal Change in Gait Speed: Normal Gait with Horizontal Head Turns: Normal Gait with Vertical Head Turns: Normal Gait and Pivot Turn: Normal Step Over Obstacle: Normal Step Around Obstacles: Normal Steps: Mild Impairment Total Score: 23       Pertinent Vitals/Pain Pain Assessment Pain Assessment: No/denies pain    Home Living Family/patient expects to be discharged to:: Private residence Living Arrangements: Children Available Help at Discharge: Family;Available 24 hours/day (son works remotely)  Type of Home: House Home Access: Stairs to enter Entrance Stairs-Rails: None Entrance Stairs-Number of Steps: 1 Alternate Level Stairs-Number of Steps: flight Home Layout: Two level Home  Equipment: None      Prior Function Prior Level of Function : Independent/Modified Independent;Driving;Working/employed (works from home)             Mobility Comments: indep ADLs Comments: indep     Journalist, newspaper   Dominant Hand: Right    Extremity/Trunk Assessment   Upper Extremity Assessment Upper Extremity Assessment: Overall WFL for tasks assessed    Lower Extremity Assessment Lower Extremity Assessment: Overall WFL for tasks assessed    Cervical / Trunk Assessment Cervical / Trunk Assessment: Normal  Communication   Communication: No difficulties  Cognition Arousal/Alertness: Awake/alert Behavior During Therapy: WFL for tasks assessed/performed Overall Cognitive Status: Within Functional Limits for tasks assessed                                 General Comments: pt reports "I'm assessing my vision daily" Pt appreciates all the education she has received this hospital stay        General Comments General comments (skin integrity, edema, etc.): VSS, educated on "BE FAST"    Exercises     Assessment/Plan    PT Assessment Patient does not need any further PT services  PT Problem List         PT Treatment Interventions      PT Goals (Current goals can be found in the Care Plan section)  Acute Rehab PT Goals Patient Stated Goal: home PT Goal Formulation: All assessment and education complete, DC therapy    Frequency       Co-evaluation               AM-PAC PT "6 Clicks" Mobility  Outcome Measure Help needed turning from your back to your side while in a flat bed without using bedrails?: None Help needed moving from lying on your back to sitting on the side of a flat bed without using bedrails?: None Help needed moving to and from a bed to a chair (including a wheelchair)?: None Help needed standing up from a chair using your arms (e.g., wheelchair or bedside chair)?: None Help needed to walk in hospital room?: None Help  needed climbing 3-5 steps with a railing? : A Little 6 Click Score: 23    End of Session Equipment Utilized During Treatment: Gait belt Activity Tolerance: Patient tolerated treatment well Patient left: in bed;with call bell/phone within reach;with family/visitor present (sitting EOB) Nurse Communication: Mobility status PT Visit Diagnosis: Unsteadiness on feet (R26.81)    Time: 4580-9983 PT Time Calculation (min) (ACUTE ONLY): 14 min   Charges:   PT Evaluation $PT Eval Low Complexity: 1 Low          [name], PT, DPT Acute Rehabilitation Services Secure chat preferred Office #: [phone]   Berline Lopes 05/11/2022, 9:11 AM

## 2022-05-11 NOTE — TOC Transition Note (Signed)
Transition of Care [facility]) - CM/SW Discharge Note   Patient Details  Name: Natasha Conner MRN: [id number] Date of Birth: 30-Sep-1965  [facility] [place] CM/SW Contact:  Pollie Friar, RN Phone Number: 05/11/2022, 11:53 AM   Clinical Narrative:    Pt discharging home with self care. No needs per PT.  Pt has transportation home.    Final next level of care: Home/Self Care Barriers to Discharge: No Barriers Identified   Patient Goals and CMS Choice        Discharge Placement                       Discharge Plan and Services                                     Social Determinants of Health (SDOH) Interventions     Readmission Risk Interventions     No data to display
